# Patient Record
Sex: Male | Born: 1975 | Race: White | Hispanic: No | Marital: Married | Smoking: Never smoker
Health system: Southern US, Community
[De-identification: ages and names within clinical notes are randomized; demographics above are authoritative.]

## PROBLEM LIST (undated history)

## (undated) DIAGNOSIS — K219 Gastro-esophageal reflux disease without esophagitis: Secondary | ICD-10-CM

## (undated) DIAGNOSIS — T8859XA Other complications of anesthesia, initial encounter: Secondary | ICD-10-CM

## (undated) DIAGNOSIS — I82409 Acute embolism and thrombosis of unspecified deep veins of unspecified lower extremity: Secondary | ICD-10-CM

## (undated) DIAGNOSIS — M5136 Other intervertebral disc degeneration, lumbar region: Secondary | ICD-10-CM

## (undated) DIAGNOSIS — F419 Anxiety disorder, unspecified: Secondary | ICD-10-CM

## (undated) DIAGNOSIS — Z87442 Personal history of urinary calculi: Secondary | ICD-10-CM

## (undated) DIAGNOSIS — M51369 Other intervertebral disc degeneration, lumbar region without mention of lumbar back pain or lower extremity pain: Secondary | ICD-10-CM

## (undated) DIAGNOSIS — I2699 Other pulmonary embolism without acute cor pulmonale: Secondary | ICD-10-CM

## (undated) DIAGNOSIS — T4145XA Adverse effect of unspecified anesthetic, initial encounter: Secondary | ICD-10-CM

## (undated) DIAGNOSIS — Z889 Allergy status to unspecified drugs, medicaments and biological substances status: Secondary | ICD-10-CM

## (undated) HISTORY — DX: Other pulmonary embolism without acute cor pulmonale: I26.99

## (undated) HISTORY — PX: COLONOSCOPY: SHX174

## (undated) HISTORY — DX: Acute embolism and thrombosis of unspecified deep veins of unspecified lower extremity: I82.409

## (undated) HISTORY — DX: Morbid (severe) obesity due to excess calories: E66.01

## (undated) HISTORY — PX: ESOPHAGOGASTRODUODENOSCOPY: SHX1529

## (undated) HISTORY — PX: BACK SURGERY: SHX140

## (undated) HISTORY — PX: OTHER SURGICAL HISTORY: SHX169

## (undated) HISTORY — PX: KNEE SURGERY: SHX244

## (undated) HISTORY — DX: Gastro-esophageal reflux disease without esophagitis: K21.9

---

## 2000-05-28 ENCOUNTER — Ambulatory Visit (HOSPITAL_COMMUNITY): Admission: RE | Admit: 2000-05-28 | Discharge: 2000-05-28 | Payer: Self-pay | Admitting: Neurosurgery

## 2014-04-14 ENCOUNTER — Other Ambulatory Visit (HOSPITAL_COMMUNITY): Payer: Self-pay | Admitting: *Deleted

## 2014-04-14 ENCOUNTER — Encounter (HOSPITAL_COMMUNITY): Payer: Self-pay | Admitting: Pharmacy Technician

## 2014-04-14 ENCOUNTER — Encounter (HOSPITAL_COMMUNITY): Payer: Self-pay | Admitting: *Deleted

## 2014-04-15 ENCOUNTER — Ambulatory Visit (HOSPITAL_COMMUNITY): Payer: BC Managed Care – PPO | Admitting: Critical Care Medicine

## 2014-04-15 ENCOUNTER — Observation Stay (HOSPITAL_COMMUNITY)
Admission: RE | Admit: 2014-04-15 | Discharge: 2014-04-16 | Disposition: A | Discharge status: Home / Self Care | Payer: BC Managed Care – PPO | Source: Ambulatory Visit | Attending: Neurosurgery | Admitting: Neurosurgery

## 2014-04-15 ENCOUNTER — Encounter (HOSPITAL_COMMUNITY): Payer: Self-pay | Admitting: *Deleted

## 2014-04-15 ENCOUNTER — Ambulatory Visit (HOSPITAL_COMMUNITY): Payer: BC Managed Care – PPO

## 2014-04-15 ENCOUNTER — Encounter (HOSPITAL_COMMUNITY)
Admission: RE | Disposition: A | Discharge status: Home / Self Care | Payer: Self-pay | Source: Ambulatory Visit | Attending: Neurosurgery

## 2014-04-15 ENCOUNTER — Encounter (HOSPITAL_COMMUNITY): Payer: BC Managed Care – PPO | Admitting: Critical Care Medicine

## 2014-04-15 DIAGNOSIS — M5126 Other intervertebral disc displacement, lumbar region: Principal | ICD-10-CM | POA: Insufficient documentation

## 2014-04-15 DIAGNOSIS — K219 Gastro-esophageal reflux disease without esophagitis: Secondary | ICD-10-CM | POA: Insufficient documentation

## 2014-04-15 HISTORY — PX: LUMBAR LAMINECTOMY/DECOMPRESSION MICRODISCECTOMY: SHX5026

## 2014-04-15 LAB — CBC
HEMATOCRIT: 45.6 % (ref 39.0–52.0)
Hemoglobin: 16.3 g/dL (ref 13.0–17.0)
MCH: 32.9 pg (ref 26.0–34.0)
MCHC: 35.7 g/dL (ref 30.0–36.0)
MCV: 91.9 fL (ref 78.0–100.0)
Platelets: 223 10*3/uL (ref 150–400)
RBC: 4.96 MIL/uL (ref 4.22–5.81)
RDW: 13 % (ref 11.5–15.5)
WBC: 8.7 10*3/uL (ref 4.0–10.5)

## 2014-04-15 LAB — SURGICAL PCR SCREEN
MRSA, PCR: NEGATIVE
Staphylococcus aureus: NEGATIVE

## 2014-04-15 SURGERY — LUMBAR LAMINECTOMY/DECOMPRESSION MICRODISCECTOMY 1 LEVEL
Anesthesia: General | Site: Back | Laterality: Left

## 2014-04-15 MED ORDER — ALUM & MAG HYDROXIDE-SIMETH 200-200-20 MG/5ML PO SUSP: 30.0000 mL | Freq: Four times a day (QID) | ORAL | Status: DC | PRN

## 2014-04-15 MED ORDER — ACETAMINOPHEN 325 MG PO TABS: 650.0000 mg | ORAL_TABLET | ORAL | Status: DC | PRN

## 2014-04-15 MED ORDER — MIDAZOLAM HCL 2 MG/2ML IJ SOLN: 0.5000 mg | Freq: Once | INTRAMUSCULAR | Status: DC | PRN

## 2014-04-15 MED ORDER — MIDAZOLAM HCL 2 MG/2ML IJ SOLN
INTRAMUSCULAR | Status: AC
  Filled 2014-04-15: qty 2

## 2014-04-15 MED ORDER — OXYCODONE-ACETAMINOPHEN 5-325 MG PO TABS
1.0000 | ORAL_TABLET | ORAL | Status: DC | PRN
Start: 2014-04-15 — End: 2014-04-15

## 2014-04-15 MED ORDER — LIDOCAINE HCL (CARDIAC) 20 MG/ML IV SOLN
INTRAVENOUS | Status: DC | PRN
  Administered 2014-04-15: 20 mg via INTRAVENOUS

## 2014-04-15 MED ORDER — MEPERIDINE HCL 25 MG/ML IJ SOLN
INTRAMUSCULAR | Status: AC
  Filled 2014-04-15: qty 1

## 2014-04-15 MED ORDER — CEFAZOLIN SODIUM-DEXTROSE 2-3 GM-% IV SOLR
INTRAVENOUS | Status: AC
  Filled 2014-04-15: qty 50

## 2014-04-15 MED ORDER — OXYCODONE HCL 5 MG/5ML PO SOLN: 5.0000 mg | Freq: Once | ORAL | Status: AC | PRN

## 2014-04-15 MED ORDER — 0.9 % SODIUM CHLORIDE (POUR BTL) OPTIME
TOPICAL | Status: DC | PRN
  Administered 2014-04-15: 1000 mL

## 2014-04-15 MED ORDER — OXYCODONE HCL 5 MG/5ML PO SOLN: 5.0000 mg | Freq: Once | ORAL | Status: DC | PRN

## 2014-04-15 MED ORDER — HYDROMORPHONE HCL PF 1 MG/ML IJ SOLN: 0.2500 mg | INTRAMUSCULAR | Status: DC | PRN

## 2014-04-15 MED ORDER — OXYCODONE-ACETAMINOPHEN 10-325 MG PO TABS
1.0000 | ORAL_TABLET | ORAL | Status: DC | PRN
Start: 2014-04-15 — End: 2014-04-15

## 2014-04-15 MED ORDER — ROCURONIUM BROMIDE 100 MG/10ML IV SOLN
INTRAVENOUS | Status: DC | PRN
  Administered 2014-04-15: 50 mg via INTRAVENOUS

## 2014-04-15 MED ORDER — MEPERIDINE HCL 25 MG/ML IJ SOLN
6.2500 mg | INTRAMUSCULAR | Status: DC | PRN
  Administered 2014-04-15: 12.5 mg via INTRAVENOUS

## 2014-04-15 MED ORDER — FENTANYL CITRATE 0.05 MG/ML IJ SOLN
INTRAMUSCULAR | Status: DC | PRN
  Administered 2014-04-15: 150 ug via INTRAVENOUS
  Administered 2014-04-15: 100 ug via INTRAVENOUS

## 2014-04-15 MED ORDER — LIDOCAINE HCL (CARDIAC) 20 MG/ML IV SOLN
INTRAVENOUS | Status: AC
  Filled 2014-04-15: qty 5

## 2014-04-15 MED ORDER — MUPIROCIN 2 % EX OINT
TOPICAL_OINTMENT | Freq: Two times a day (BID) | CUTANEOUS | Status: DC
  Administered 2014-04-15: 1 via NASAL

## 2014-04-15 MED ORDER — OXYCODONE HCL 5 MG PO TABS: 5.0000 mg | ORAL_TABLET | Freq: Once | ORAL | Status: DC | PRN

## 2014-04-15 MED ORDER — THROMBIN 5000 UNITS EX SOLR
CUTANEOUS | Status: DC | PRN
  Administered 2014-04-15 (×2): 5000 [IU] via TOPICAL

## 2014-04-15 MED ORDER — MUPIROCIN 2 % EX OINT
TOPICAL_OINTMENT | CUTANEOUS | Status: AC
  Filled 2014-04-15: qty 22

## 2014-04-15 MED ORDER — PHENOL 1.4 % MT LIQD: 1.0000 | OROMUCOSAL | Status: DC | PRN

## 2014-04-15 MED ORDER — DOCUSATE SODIUM 100 MG PO CAPS
100.0000 mg | ORAL_CAPSULE | Freq: Two times a day (BID) | ORAL | Status: DC
  Filled 2014-04-15 (×3): qty 1

## 2014-04-15 MED ORDER — FENTANYL CITRATE 0.05 MG/ML IJ SOLN
INTRAMUSCULAR | Status: AC
  Filled 2014-04-15: qty 5

## 2014-04-15 MED ORDER — DIAZEPAM 5 MG PO TABS: 5.0000 mg | ORAL_TABLET | Freq: Four times a day (QID) | ORAL | Status: DC | PRN

## 2014-04-15 MED ORDER — BUPIVACAINE-EPINEPHRINE (PF) 0.5% -1:200000 IJ SOLN
INTRAMUSCULAR | Status: DC | PRN
  Administered 2014-04-15: 10 mL

## 2014-04-15 MED ORDER — CEFAZOLIN SODIUM-DEXTROSE 2-3 GM-% IV SOLR
2.0000 g | Freq: Three times a day (TID) | INTRAVENOUS | Status: DC
  Administered 2014-04-15: 2 g via INTRAVENOUS
  Filled 2014-04-15 (×2): qty 50

## 2014-04-15 MED ORDER — MIDAZOLAM HCL 5 MG/5ML IJ SOLN
INTRAMUSCULAR | Status: DC | PRN
  Administered 2014-04-15: 2 mg via INTRAVENOUS

## 2014-04-15 MED ORDER — SODIUM CHLORIDE 0.9 % IR SOLN
Status: DC | PRN
  Administered 2014-04-15: 17:00:00

## 2014-04-15 MED ORDER — PROMETHAZINE HCL 25 MG/ML IJ SOLN: 6.2500 mg | INTRAMUSCULAR | Status: DC | PRN

## 2014-04-15 MED ORDER — ARTIFICIAL TEARS OP OINT
TOPICAL_OINTMENT | OPHTHALMIC | Status: AC
  Filled 2014-04-15: qty 3.5

## 2014-04-15 MED ORDER — MUPIROCIN 2 % EX OINT
TOPICAL_OINTMENT | Freq: Two times a day (BID) | CUTANEOUS | Status: DC
  Filled 2014-04-15: qty 22

## 2014-04-15 MED ORDER — ROCURONIUM BROMIDE 50 MG/5ML IV SOLN
INTRAVENOUS | Status: AC
  Filled 2014-04-15: qty 1

## 2014-04-15 MED ORDER — ONDANSETRON HCL 4 MG/2ML IJ SOLN
INTRAMUSCULAR | Status: DC | PRN
  Administered 2014-04-15: 4 mg via INTRAVENOUS

## 2014-04-15 MED ORDER — OXYCODONE-ACETAMINOPHEN 5-325 MG PO TABS
1.0000 | ORAL_TABLET | ORAL | Status: DC | PRN
  Administered 2014-04-16: 2 via ORAL
  Filled 2014-04-15: qty 2

## 2014-04-15 MED ORDER — OXYCODONE HCL 5 MG PO TABS
5.0000 mg | ORAL_TABLET | Freq: Once | ORAL | Status: AC | PRN
  Administered 2014-04-15: 5 mg via ORAL

## 2014-04-15 MED ORDER — NEOSTIGMINE METHYLSULFATE 10 MG/10ML IV SOLN
INTRAVENOUS | Status: DC | PRN
  Administered 2014-04-15: 3 mg via INTRAVENOUS

## 2014-04-15 MED ORDER — PROPOFOL 10 MG/ML IV BOLUS
INTRAVENOUS | Status: DC | PRN
  Administered 2014-04-15: 200 mg via INTRAVENOUS

## 2014-04-15 MED ORDER — MEPERIDINE HCL 25 MG/ML IJ SOLN: 6.2500 mg | INTRAMUSCULAR | Status: DC | PRN

## 2014-04-15 MED ORDER — CEFAZOLIN SODIUM-DEXTROSE 2-3 GM-% IV SOLR
INTRAVENOUS | Status: DC | PRN
  Administered 2014-04-15: 2 g via INTRAVENOUS

## 2014-04-15 MED ORDER — MENTHOL 3 MG MT LOZG: 1.0000 | LOZENGE | OROMUCOSAL | Status: DC | PRN

## 2014-04-15 MED ORDER — HEMOSTATIC AGENTS (NO CHARGE) OPTIME
TOPICAL | Status: DC | PRN
  Administered 2014-04-15: 1 via TOPICAL

## 2014-04-15 MED ORDER — ARTIFICIAL TEARS OP OINT
TOPICAL_OINTMENT | OPHTHALMIC | Status: DC | PRN
  Administered 2014-04-15: 1 via OPHTHALMIC

## 2014-04-15 MED ORDER — LACTATED RINGERS IV SOLN
INTRAVENOUS | Status: DC
  Administered 2014-04-15: 12:00:00 via INTRAVENOUS

## 2014-04-15 MED ORDER — OXYCODONE HCL 5 MG PO TABS: 5.0000 mg | ORAL_TABLET | ORAL | Status: DC | PRN

## 2014-04-15 MED ORDER — GLYCOPYRROLATE 0.2 MG/ML IJ SOLN
INTRAMUSCULAR | Status: AC
  Filled 2014-04-15: qty 2

## 2014-04-15 MED ORDER — MORPHINE SULFATE 2 MG/ML IJ SOLN: 1.0000 mg | INTRAMUSCULAR | Status: DC | PRN

## 2014-04-15 MED ORDER — HYDROCODONE-ACETAMINOPHEN 5-325 MG PO TABS
1.0000 | ORAL_TABLET | ORAL | Status: DC | PRN
Start: 2014-04-15 — End: 2014-04-16

## 2014-04-15 MED ORDER — ACETAMINOPHEN 650 MG RE SUPP: 650.0000 mg | RECTAL | Status: DC | PRN

## 2014-04-15 MED ORDER — ONDANSETRON HCL 4 MG/2ML IJ SOLN: 4.0000 mg | INTRAMUSCULAR | Status: DC | PRN

## 2014-04-15 MED ORDER — LACTATED RINGERS IV SOLN: INTRAVENOUS | Status: DC

## 2014-04-15 MED ORDER — GLYCOPYRROLATE 0.2 MG/ML IJ SOLN
INTRAMUSCULAR | Status: DC | PRN
  Administered 2014-04-15: 0.4 mg via INTRAVENOUS

## 2014-04-15 MED ORDER — ONDANSETRON HCL 4 MG/2ML IJ SOLN
INTRAMUSCULAR | Status: AC
  Filled 2014-04-15: qty 2

## 2014-04-15 MED ORDER — HYDROMORPHONE HCL PF 1 MG/ML IJ SOLN
0.2500 mg | INTRAMUSCULAR | Status: DC | PRN
  Administered 2014-04-15: 0.5 mg via INTRAVENOUS

## 2014-04-15 MED ORDER — HYDROMORPHONE HCL PF 1 MG/ML IJ SOLN
INTRAMUSCULAR | Status: AC
  Filled 2014-04-15: qty 1

## 2014-04-15 MED ORDER — OXYCODONE HCL 5 MG PO TABS
ORAL_TABLET | ORAL | Status: AC
  Filled 2014-04-15: qty 1

## 2014-04-15 SURGICAL SUPPLY — 60 items
APL SKNCLS STERI-STRIP NONHPOA (GAUZE/BANDAGES/DRESSINGS) ×1
BAG DECANTER FOR FLEXI CONT (MISCELLANEOUS) ×3 IMPLANT
BENZOIN TINCTURE PRP APPL 2/3 (GAUZE/BANDAGES/DRESSINGS) ×3 IMPLANT
BLADE 10 SAFETY STRL DISP (BLADE) ×3 IMPLANT
BLADE SURG ROTATE 9660 (MISCELLANEOUS) IMPLANT
BRUSH SCRUB EZ PLAIN DRY (MISCELLANEOUS) ×3 IMPLANT
BUR ACORN 6.0 (BURR) ×2 IMPLANT
BUR ACORN 6.0MM (BURR) ×1
BUR MATCHSTICK NEURO 3.0 LAGG (BURR) ×3 IMPLANT
CANISTER SUCT 3000ML (MISCELLANEOUS) ×3 IMPLANT
CLOSURE WOUND 1/2 X4 (GAUZE/BANDAGES/DRESSINGS) ×1
CONT SPEC 4OZ CLIKSEAL STRL BL (MISCELLANEOUS) ×3 IMPLANT
DRAPE LAPAROTOMY 100X72X124 (DRAPES) ×3 IMPLANT
DRAPE MICROSCOPE LEICA (MISCELLANEOUS) ×3 IMPLANT
DRAPE POUCH INSTRU U-SHP 10X18 (DRAPES) ×3 IMPLANT
DRAPE SURG 17X23 STRL (DRAPES) ×12 IMPLANT
ELECT BLADE 4.0 EZ CLEAN MEGAD (MISCELLANEOUS) ×3
ELECT REM PT RETURN 9FT ADLT (ELECTROSURGICAL) ×3
ELECTRODE BLDE 4.0 EZ CLN MEGD (MISCELLANEOUS) ×1 IMPLANT
ELECTRODE REM PT RTRN 9FT ADLT (ELECTROSURGICAL) ×1 IMPLANT
GAUZE SPONGE 4X4 16PLY XRAY LF (GAUZE/BANDAGES/DRESSINGS) IMPLANT
GLOVE BIO SURGEON STRL SZ8.5 (GLOVE) ×3 IMPLANT
GLOVE BIOGEL PI IND STRL 7.5 (GLOVE) IMPLANT
GLOVE BIOGEL PI INDICATOR 7.5 (GLOVE) ×2
GLOVE ECLIPSE 7.5 STRL STRAW (GLOVE) ×4 IMPLANT
GLOVE EXAM NITRILE LRG STRL (GLOVE) IMPLANT
GLOVE EXAM NITRILE MD LF STRL (GLOVE) ×2 IMPLANT
GLOVE EXAM NITRILE XL STR (GLOVE) IMPLANT
GLOVE EXAM NITRILE XS STR PU (GLOVE) IMPLANT
GLOVE SS BIOGEL STRL SZ 8 (GLOVE) ×1 IMPLANT
GLOVE SUPERSENSE BIOGEL SZ 8 (GLOVE) ×2
GOWN BRE IMP SLV AUR LG STRL (GOWN DISPOSABLE) IMPLANT
GOWN BRE IMP SLV AUR XL STRL (GOWN DISPOSABLE) ×1 IMPLANT
GOWN STRL REIN 2XL LVL4 (GOWN DISPOSABLE) IMPLANT
GOWN STRL REUS W/ TWL LRG LVL3 (GOWN DISPOSABLE) IMPLANT
GOWN STRL REUS W/ TWL XL LVL3 (GOWN DISPOSABLE) IMPLANT
GOWN STRL REUS W/TWL LRG LVL3 (GOWN DISPOSABLE) ×3
GOWN STRL REUS W/TWL XL LVL3 (GOWN DISPOSABLE) ×6
KIT BASIN OR (CUSTOM PROCEDURE TRAY) ×3 IMPLANT
KIT ROOM TURNOVER OR (KITS) ×3 IMPLANT
NDL HYPO 21X1.5 SAFETY (NEEDLE) IMPLANT
NEEDLE HYPO 21X1.5 SAFETY (NEEDLE) IMPLANT
NEEDLE HYPO 22GX1.5 SAFETY (NEEDLE) ×3 IMPLANT
NS IRRIG 1000ML POUR BTL (IV SOLUTION) ×3 IMPLANT
PACK LAMINECTOMY NEURO (CUSTOM PROCEDURE TRAY) ×3 IMPLANT
PAD ARMBOARD 7.5X6 YLW CONV (MISCELLANEOUS) ×9 IMPLANT
PATTIES SURGICAL .5 X1 (DISPOSABLE) IMPLANT
RUBBERBAND STERILE (MISCELLANEOUS) ×6 IMPLANT
SPONGE GAUZE 4X4 12PLY (GAUZE/BANDAGES/DRESSINGS) ×3 IMPLANT
SPONGE SURGIFOAM ABS GEL SZ50 (HEMOSTASIS) ×3 IMPLANT
STRIP CLOSURE SKIN 1/2X4 (GAUZE/BANDAGES/DRESSINGS) ×2 IMPLANT
SUT VIC AB 1 CT1 18XBRD ANBCTR (SUTURE) ×1 IMPLANT
SUT VIC AB 1 CT1 8-18 (SUTURE) ×6
SUT VIC AB 2-0 CP2 18 (SUTURE) ×5 IMPLANT
SYR 20CC LL (SYRINGE) IMPLANT
SYR 20ML ECCENTRIC (SYRINGE) ×3 IMPLANT
TAPE CLOTH SURG 4X10 WHT LF (GAUZE/BANDAGES/DRESSINGS) ×2 IMPLANT
TOWEL OR 17X24 6PK STRL BLUE (TOWEL DISPOSABLE) ×3 IMPLANT
TOWEL OR 17X26 10 PK STRL BLUE (TOWEL DISPOSABLE) ×3 IMPLANT
WATER STERILE IRR 1000ML POUR (IV SOLUTION) ×3 IMPLANT

## 2014-04-15 NOTE — Anesthesia Preprocedure Evaluation (Addendum)
Anesthesia Evaluation  Patient identified by MRN, date of birth, ID band Patient awake    Reviewed: Allergy & Precautions, H&P , NPO status , Patient's Chart, lab work & pertinent test results  History of Anesthesia Complications Negative for: history of anesthetic complications  Airway Mallampati: II TM Distance: >3 FB Neck ROM: Full    Dental  (+) Dental Advisory Given   Pulmonary neg pulmonary ROS,  breath sounds clear to auscultation  Pulmonary exam normal       Cardiovascular negative cardio ROS  Rhythm:Regular Rate:Normal     Neuro/Psych Chronic back pain    GI/Hepatic Neg liver ROS, GERD-  Controlled,  Endo/Other  Morbid obesity  Renal/GU negative Renal ROS     Musculoskeletal   Abdominal (+) + obese,   Peds  Hematology   Anesthesia Other Findings   Reproductive/Obstetrics                         Anesthesia Physical Anesthesia Plan  ASA: II  Anesthesia Plan: General   Post-op Pain Management:    Induction: Intravenous  Airway Management Planned: Oral ETT  Additional Equipment:   Intra-op Plan:   Post-operative Plan: Extubation in OR  Informed Consent: I have reviewed the patients History and Physical, chart, labs and discussed the procedure including the risks, benefits and alternatives for the proposed anesthesia with the patient or authorized representative who has indicated his/her understanding and acceptance.   Dental advisory given  Plan Discussed with: Anesthesiologist, Surgeon and CRNA  Anesthesia Plan Comments: (Plan routine monitors, GETA)       Anesthesia Quick Evaluation

## 2014-04-15 NOTE — Progress Notes (Signed)
Patient ID: Luis Roberts, male   DOB: [birth year], 38 y.o.   MRN: [id number] Subjective:  The patient is alert and pleasant. He is in no apparent distress.  Objective: Vital signs in last 24 hours: Temp:  [98.4 F (36.9 C)] 98.4 F (36.9 C) (04/30 1210) Pulse Rate:  [81] 81 (04/30 1210) Resp:  [20] 20 (04/30 1210) BP: (146)/(85) 146/85 mmHg (04/30 1218) SpO2:  [100 %] 100 % (04/30 1210) Weight:  [120.657 kg (266 lb)] 120.657 kg (266 lb) (04/30 1210)  Intake/Output from previous day:   Intake/Output this shift: Total I/O In: 700 [I.V.:700] Out: 50 [Blood:50]  Physical exam the patient is alert and pleasant. He is moving his lower extremities well.  Lab Results:  Recent Labs  04/15/14 1208  WBC 8.7  HGB 16.3  HCT 45.6  PLT 223   BMET No results found for this basename: NA, K, CL, CO2, GLUCOSE, BUN, CREATININE, CALCIUM,  in the last 72 hours  Studies/Results: No results found.  Assessment/Plan: The patient is doing well.  LOS: 0 days     [name] 04/15/2014, 6:12 PM

## 2014-04-15 NOTE — Op Note (Signed)
Brief history: The patient is a 38 year old white male on whom I previously performed a right L5-S1 discectomy many years ago. The patient has done well to recently when he developed severe back and left leg pain consistent with a lumbar radiculopathy. He was worked up with a lumbar MRI which demonstrated a large herniated disc at L4-5 on the left. I discussed the various treatment options with the patient including surgery. He has weighed the risks, benefits, and alternatives surgery and decided proceed with a left L4-5 discectomy.  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 Earle Gell  Asst.: Dr. Erline Levine  Anesthesia: Gen. endotracheal  Estimated blood loss: Minimal  Drains: None  Complications: None  Description of procedure: The patient was brought to the operating room by the anesthesia team. General endotracheal anesthesia was induced. The patient was turned to the prone position on the Wilson frame. The patient's lumbosacral region was then prepared with Betadine scrub and Betadine solution. Sterile drapes were applied.  I then injected the area to be incised with Marcaine with epinephrine solution. I then used a scalpel to make a linear midline incision over the L4-5 intervertebral disc space. I then used electrocautery to perform a left sided subperiosteal dissection exposing the spinous process and lamina of L4 and L5. We obtained intraoperative radiograph to confirm our location. I then inserted the [facility] retractor for exposure.  We then brought the operative microscope into the field. Under its magnification and illumination we completed the microdissection. I used a high-speed drill to perform a laminotomy at L4 on the left. I then used a Kerrison punches to widen the laminotomy and removed the ligamentum flavum at  L4-5 on the left and to remove the cephalad aspect of the L5 lamina.. We then used microdissection to free up the thecal sac and the left L5 nerve root from the epidural tissue. I then used a Kerrison punch to perform a foraminotomy at about the L5 nerve root. We then using the nerve root retractor to gently retract the thecal sac and the left L5 nerve root medially. This exposed the intervertebral disc. We identified the ruptured disc and remove it with the pituitary forceps. We encountered a moderate size hole in the annulus and performed a partial intervertebral discectomy.  I then palpated along the ventral surface of the thecal sac and along exit route of the L5 nerve root and noted that the neural structures were well decompressed. This completed the decompression.  We then obtained hemostasis using bipolar electrocautery. We irrigated the wound out with bacitracin solution. We then removed the retractor. We then reapproximated the patient's thoracolumbar fascia with interrupted #1 Vicryl suture. We then reapproximated the patient's subcutaneous tissue with interrupted 2-0 Vicryl suture. At this point I was informed of the sponge count was incorrect by one sponge. I reopened the wound and explored the wound I did not find a sponge. We obtained intraoperative radiograph which did not demonstrate any foreign bodies in the patient's wound. A sponge was subtotally found outside of the operating room in the lateral edge. We can only assume this was the missing sponge. I then removed the retractor and reapproximated patient's thoracolumbar fascia with interrupted #1 Vicryl suture. We reapproximated patient's subcutaneous suture with interrupted 2-0 Vicryl suture.  We then reapproximated patient's skin with Steri-Strips and benzoin. The was then coated with bacitracin ointment. The drapes were removed. The  patient was subsequently returned to the supine position where they were extubated by the anesthesia team.  The patient was then transported to the postanesthesia care unit in stable condition.

## 2014-04-15 NOTE — Plan of Care (Signed)
Problem: Consults Goal: Diagnosis - Spinal Surgery Outcome: Completed/Met Date Met:  04/15/14 Lumbar Laminectomy (Complex)     

## 2014-04-15 NOTE — Anesthesia Procedure Notes (Signed)
Procedure Name: Intubation Date/Time: 04/15/2014 4:29 PM Performed by: Carola Frost Pre-anesthesia Checklist: Patient identified, Timeout performed, Emergency Drugs available, Suction available and Patient being monitored Patient Re-evaluated:Patient Re-evaluated prior to inductionOxygen Delivery Method: Circle system utilized Preoxygenation: Pre-oxygenation with 100% oxygen Intubation Type: IV induction Ventilation: Mask ventilation without difficulty Laryngoscope Size: Mac and 4 Grade View: Grade I Tube type: Oral Tube size: 7.5 mm Number of attempts: 1 Airway Equipment and Method: Stylet Placement Confirmation: CO2 detector,  positive ETCO2,  ETT inserted through vocal cords under direct vision and breath sounds checked- equal and bilateral Secured at: 23 cm Tube secured with: Tape Dental Injury: Teeth and Oropharynx as per pre-operative assessment

## 2014-04-15 NOTE — H&P (Signed)
Subjective: The patient is a 38 year old white male on whom I  previously performed a right L5-S1 discectomy. The patient has done well for years but has developed recurrent back pain with pain rate down his left leg. He has failed medical management and was worked up with a lumbar MRI. This demonstrated a large herniated disc at L4-5 on the left. I discussed the various treatment option with the patient including surgery. He has weighed the risks, benefits, and alternatives surgery and decided proceed with a left L4-5 discectomy.  History reviewed. No pertinent past medical history.  Past Surgical History  Procedure Laterality Date  . Arthroscopic knee surgery    . Knee surgery      left knee w/ screw  . Back surgery      lumbar laminectomy    Allergies  Allergen Reactions  . Aleve [Naproxen Sodium] Other (See Comments)    Makes his skin feel like it's crawling    History  Substance Use Topics  . Smoking status: Never Smoker   . Smokeless tobacco: Never Used  . Alcohol Use: Yes     Comment: socially    Family History  Problem Relation Age of Onset  . Hypertension Mother   . Pulmonary embolism Father    Prior to Admission medications   Medication Sig Start Date End Date Taking? Authorizing Provider  Glucos-Chondroit-Hyaluron-MSM (GLUCOSAMINE CHONDROITIN JOINT PO) Take 2 capsules by mouth daily.   Yes Historical Provider, MD  Multiple Vitamin (MULTIVITAMIN WITH MINERALS) TABS tablet Take 1 tablet by mouth daily.   Yes Historical Provider, MD  Omega-3 Fatty Acids (FISH OIL PO) Take 2 capsules by mouth daily.   Yes Historical Provider, MD  oxyCODONE-acetaminophen (PERCOCET) 10-325 MG per tablet Take 1 tablet by mouth every 4 (four) hours as needed for pain.   Yes Historical Provider, MD     Review of Systems  Positive ROS: As above  All other systems have been reviewed and were otherwise negative with the exception of those mentioned in the HPI and as  above.  Objective: Vital signs in last 24 hours:    General Appearance: Alert, cooperative, no distress, Head: Normocephalic, without obvious abnormality, atraumatic Eyes: PERRL, conjunctiva/corneas clear, EOM's intact,    Ears: Normal  Throat: Normal  Neck: Supple, symmetrical, trachea midline, no adenopathy; thyroid: No enlargement/tenderness/nodules; no carotid bruit or JVD Back: Symmetric, no curvature, ROM normal, no CVA tenderness Lungs: Clear to auscultation bilaterally, respirations unlabored Heart: Regular rate and rhythm, no murmur, rub or gallop Abdomen: Soft, non-tender,, no masses, no organomegaly Extremities: Extremities normal, atraumatic, no cyanosis or edema Pulses: 2+ and symmetric all extremities Skin: Skin color, texture, turgor normal, no rashes or lesions  NEUROLOGIC:   Mental status: alert and oriented, no aphasia, good attention span, Fund of knowledge/ memory ok Motor Exam - grossly normal, except the patient has some slight weakness in his left EHL. Sensory Exam - grossly normal Reflexes:  Coordination - grossly normal Gait - grossly normal Balance - grossly normal Cranial Nerves: I: smell Not tested  II: visual acuity  OS: Normal  OD: Normal   II: visual fields Full to confrontation  II: pupils Equal, round, reactive to light  III,VII: ptosis None  III,IV,VI: extraocular muscles  Full ROM  V: mastication Normal  V: facial light touch sensation  Normal  V,VII: corneal reflex  Present  VII: facial muscle function - upper  Normal  VII: facial muscle function - lower Normal  VIII: hearing Not tested  IX: soft palate elevation  Normal  IX,X: gag reflex Present  XI: trapezius strength  5/5  XI: sternocleidomastoid strength 5/5  XI: neck flexion strength  5/5  XII: tongue strength  Normal    Data Review No results found for this basename: WBC, HGB, HCT, MCV, PLT   No results found for this basename: NA, K, CL, CO2, BUN, creatinine, glucose    No results found for this basename: INR, PROTIME    Assessment/Plan: Left L4-5 herniated disc, lumbago, lumbar radiculopathy, lumbar spinal stenosis: I discussed situation with the patient. I reviewed his MR scan with them and pointed out the abnormalities. We have discussed the various treatment options including a left L4-5 discectomy. I described the surgery to him. I have shown him surgical models. We have discussed the risks, benefits, alternatives, and likelihood of achieving our goals with surgery. I have answered all the patient's questions. He has decided to proceed with surgery.   [name] 04/15/2014 12:03 PM

## 2014-04-16 ENCOUNTER — Encounter (HOSPITAL_COMMUNITY): Payer: Self-pay | Admitting: Neurosurgery

## 2014-04-16 MED ORDER — OXYCODONE-ACETAMINOPHEN 10-325 MG PO TABS: 1.0000 | ORAL_TABLET | ORAL | Status: DC | PRN

## 2014-04-16 MED ORDER — DSS 100 MG PO CAPS: 100.0000 mg | ORAL_CAPSULE | Freq: Two times a day (BID) | ORAL | Status: DC

## 2014-04-16 MED ORDER — DIAZEPAM 5 MG PO TABS: 5.0000 mg | ORAL_TABLET | Freq: Four times a day (QID) | ORAL | Status: DC | PRN

## 2014-04-16 NOTE — Discharge Instructions (Signed)
Herniated Disk The bones of your spinal column (vertebrae) protect your spinal cord and nerves that go into your arms and legs. The vertebrae are separated by disks that cushion the spinal column and put space between your vertebrae. This allows movement between the vertebrae, which allows you to bend, rotate, and move your body from side to side. Sometimes, the disks move out of place (herniate) or break open (rupture) from injury or strain. The most common area for a disk herniation is in the lower back (lumbar area). Sometimes herniation occurs in the neck (cervical) disks.  CAUSES  As we grow older, the strong, fibrous cords that connect the vertebrae and support and surround the disks (ligaments) start to weaken. A strain on the back may cause a break in the disk ligaments. RISK FACTORS Herniated disks occur most often in men who are aged 18 years to 35 years, usually after strenuous activity. Other risk factors include conditions present at birth (congenital) that affect the size of the lumbar spinal canal. Additionally, a narrowing of the areas where the nerves exit the spinal canal can occur as you age. SYMPTOMS  Symptoms of a herniated disk vary. You may have weakness in certain muscles. This weakness can include difficulty lifting your leg or arm, difficulty standing on your toes on one side, or difficulty squeezing tightly with one of your hands. You may have numbness. You may feel a mild tingling, dull ache, or a burning or pulsating pain. In some cases, the pain is severe enough that you are unable to move. The pain most often occurs on one side of the body. The pain often starts slowly. It may get worse:  After you sit or stand.  At night.  When you sneeze, cough, or laugh.  When you bend backwards or walk more than a few yards. The pain, numbness, or weakness will often go away or improve a lot over a period of weeks to months. Herniated lumbar disk Symptoms of a herniated lumbar  disk may include sharp pain in one part of your leg, hip, or buttocks and numbness in other parts. You also may feel pain or numbness on the back of your calf or the top or sole of your foot. The same leg also may feel weak. Herniated cervical disk Symptoms of a herniated cervical disk may include pain when you move your neck, deep pain near or over your shoulder blade, or pain that moves to your upper arm, forearm, or fingers. DIAGNOSIS  To diagnose a herniated disk, your caregiver will perform a physical exam. Your caregiver also may perform diagnostic tests to see your disk or to test the reaction of your muscles and the function of your nerves. During the physical exam, your caregiver may ask you to:  Sit, stand, and walk. While you walk, your caregiver may ask you to try walking on your toes and then your heels.  Bend forward, backward, and sideways.  Raise your shoulders, elbow, wrist, and fingers and check your strength during these tasks. Your caregiver will check for:  Numbness or loss of feeling.  Muscle reflexes, which may be slower or missing.  Muscle strength, which may be weaker.  Posture or the way your spine curves. Diagnostic tests that may be done include:  A spinal X-ray exam to rule out other causes of back pain.  Magnetic resonance imaging (MRI) or computed tomography (CT) scan, which will show if the herniated disk is pressing on your spinal canal.  Electromyography.   This is sometimes used to identify the specific area of nerve involvement. TREATMENT  Initial treatment for a herniated disk is a short period of rest with medicines for pain. Pain medicines can include nonsteroidal anti-inflammatory medicines (NSAIDs), muscle relaxants for back spasms, and (rarely) narcotic pain medicine for severe pain that does not respond to NSAID use. Bed rest is often limited to 1 or 2 days at the most because prolonged rest can delay recovery. When the herniation involves the  lower back, sitting should be avoided as much as possible because sitting increases pressure on the ruptured disk. Sometimes a soft neck collar will be prescribed for a few days to weeks to help support your neck in the case of a cervical herniation. Physical therapy is often prescribed for patients with disk disease. Physical therapists will teach you how to properly lift, dress, walk, and perform other activities. They will work on strengthening the muscles that help support your spine. In some cases, physical therapy alone is not enough to treat a herniated disk. Steroid injections along the involved nerve root may be needed to help control pain. The steroid is injected in the area of the herniated disk and helps by reducing swelling around the disk. Sometimes surgery is the best option to treat a herniated disk.  SEEK IMMEDIATE MEDICAL CARE IF:   You have numbness, tingling, weakness, or problems with the use of your arms or legs.  You have severe headaches that are not relieved with the use of medicines.  You notice a change in your bowel or bladder control.  You have increasing pain in any areas of your body.  You experience shortness of breath, dizziness, or fainting. MAKE SURE YOU:   Understand these instructions.  Will watch your condition.  Will get help right away if you are not doing well or get worse. Document Released: 11/30/2000 Document Revised: 02/25/2012 Document Reviewed: 07/06/2011 [employer] Patient Information 2014 [employer].  

## 2014-04-16 NOTE — Transfer of Care (Signed)
Immediate Anesthesia Transfer of Care Note  Patient: Luis Roberts  Procedure(s) Performed: Procedure(s) with comments: LUMBAR LAMINECTOMY/DECOMPRESSION MICRODISCECTOMY 1 LEVEL (Left) - Left Lumbar Four-Five Microdiskectomy  Patient Location: PACU  Anesthesia Type:General  Level of Consciousness: awake, alert  and oriented  Airway & Oxygen Therapy: Patient Spontanous Breathing and Patient connected to nasal cannula oxygen  Post-op Assessment: Report given to PACU RN, Post -op Vital signs reviewed and stable and Patient moving all extremities X 4  Post vital signs: Reviewed and stable  Complications: No apparent anesthesia complications

## 2014-04-16 NOTE — Progress Notes (Signed)
Pt. Alert and oriented,follows simple instructions, denies pain. Incision area without swelling, redness or S/S of infection. Voiding adequate clear yellow urine. Moving all extremities well and vitals stable and documented. Patient discharged home with spouse. Lumbar surgery notes instructions given to patient and family member for home safety and precautions. Pt. and family stated understanding of instructions given.  Pain medication given prior to discharged.

## 2014-04-16 NOTE — Anesthesia Postprocedure Evaluation (Signed)
  Anesthesia Post-op Note  Patient: Luis Roberts  Procedure(s) Performed: Procedure(s) with comments: LUMBAR LAMINECTOMY/DECOMPRESSION MICRODISCECTOMY 1 LEVEL (Left) - Left Lumbar Four-Five Microdiskectomy  Patient Location: PACU  Anesthesia Type:General  Level of Consciousness: awake  Airway and Oxygen Therapy: Patient Spontanous Breathing  Post-op Pain: mild  Post-op Assessment: Post-op Vital signs reviewed, Patient's Cardiovascular Status Stable, Respiratory Function Stable, Patent Airway, No signs of Nausea or vomiting and Pain level controlled  Post-op Vital Signs: Reviewed and stable  Last Vitals:  Filed Vitals:   04/16/14 0450  BP: 127/81  Pulse: 86  Temp: 37.2 C  Resp: 18    Complications: No apparent anesthesia complications

## 2014-04-16 NOTE — Discharge Summary (Signed)
  Physician Discharge Summary  Patient ID: Luis Roberts MRN: [id number] DOB/AGE: 02/08/1976 38 y.o.  Admit date: 04/15/2014 Discharge date: 04/16/2014  Admission Diagnoses: Left L4-5 herniated disc, lumbago, lumbar radiculopathy  Discharge Diagnoses: The same Active Problems:   Lumbar herniated disc   Discharged Condition: good  Hospital Course: I performed a left L4-5 discectomy on the patient on 04/15/2014. The surgery went well.  The patient's postoperative course was unremarkable. On postop day #1 he requested discharge to home. The patient was given oral and written discharge instructions. All his questions were answered.  Consults: None Significant Diagnostic Studies: None Treatments: Left L4-5 discectomy using microdissection Discharge Exam: Blood pressure 127/81, pulse 86, temperature 99 F (37.2 C), temperature source Oral, resp. rate 18, height 6\' 5"  (1.956 m), weight 120.657 kg (266 lb), SpO2 97.00%. The patient is alert and pleasant. Her strength is normal.  Disposition: Home  Discharge Orders   Future Orders Complete By Expires   Call MD for:  difficulty breathing, headache or visual disturbances  As directed    Call MD for:  extreme fatigue  As directed    Call MD for:  hives  As directed    Call MD for:  persistant dizziness or light-headedness  As directed    Call MD for:  persistant nausea and vomiting  As directed    Call MD for:  redness, tenderness, or signs of infection (pain, swelling, redness, odor or green/yellow discharge around incision site)  As directed    Call MD for:  severe uncontrolled pain  As directed    Call MD for:  temperature >100.4  As directed    Diet - low sodium heart healthy  As directed    Discharge instructions  As directed    Driving Restrictions  As directed    Increase activity slowly  As directed    Lifting restrictions  As directed    May shower / Bathe  As directed    Remove dressing in 48 hours  As directed         Medication List         diazepam 5 MG tablet  Commonly known as:  VALIUM  Take 1 tablet (5 mg total) by mouth every 6 (six) hours as needed for muscle spasms.     DSS 100 MG Caps  Take 100 mg by mouth 2 (two) times daily.     FISH OIL PO  Take 2 capsules by mouth daily.     GLUCOSAMINE CHONDROITIN JOINT PO  Take 2 capsules by mouth daily.     multivitamin with minerals Tabs tablet  Take 1 tablet by mouth daily.     oxyCODONE-acetaminophen 10-325 MG per tablet  Commonly known as:  PERCOCET  Take 1 tablet by mouth every 4 (four) hours as needed for pain.     oxyCODONE-acetaminophen 10-325 MG per tablet  Commonly known as:  PERCOCET  Take 1 tablet by mouth every 4 (four) hours as needed for pain.         Signed: [name] 04/16/2014, 7:34 AM

## 2017-05-17 ENCOUNTER — Encounter (HOSPITAL_COMMUNITY): Payer: Self-pay | Admitting: *Deleted

## 2017-05-17 ENCOUNTER — Other Ambulatory Visit: Payer: Self-pay | Admitting: Neurosurgery

## 2017-05-20 ENCOUNTER — Encounter (HOSPITAL_COMMUNITY)
Admission: RE | Disposition: A | Discharge status: Home / Self Care | Payer: Self-pay | Source: Ambulatory Visit | Attending: Neurosurgery

## 2017-05-20 ENCOUNTER — Ambulatory Visit (HOSPITAL_COMMUNITY): Payer: 59 | Admitting: Anesthesiology

## 2017-05-20 ENCOUNTER — Encounter (HOSPITAL_COMMUNITY): Payer: Self-pay

## 2017-05-20 ENCOUNTER — Ambulatory Visit (HOSPITAL_COMMUNITY): Payer: 59

## 2017-05-20 ENCOUNTER — Ambulatory Visit (HOSPITAL_COMMUNITY)
Admission: RE | Admit: 2017-05-20 | Discharge: 2017-05-21 | Disposition: A | Discharge status: Home / Self Care | Payer: 59 | Source: Ambulatory Visit | Attending: Neurosurgery | Admitting: Neurosurgery

## 2017-05-20 DIAGNOSIS — Z886 Allergy status to analgesic agent status: Secondary | ICD-10-CM | POA: Diagnosis not present

## 2017-05-20 DIAGNOSIS — Z79899 Other long term (current) drug therapy: Secondary | ICD-10-CM | POA: Diagnosis not present

## 2017-05-20 DIAGNOSIS — M5116 Intervertebral disc disorders with radiculopathy, lumbar region: Secondary | ICD-10-CM | POA: Insufficient documentation

## 2017-05-20 DIAGNOSIS — M4726 Other spondylosis with radiculopathy, lumbar region: Secondary | ICD-10-CM | POA: Insufficient documentation

## 2017-05-20 DIAGNOSIS — Z419 Encounter for procedure for purposes other than remedying health state, unspecified: Secondary | ICD-10-CM

## 2017-05-20 DIAGNOSIS — M21372 Foot drop, left foot: Secondary | ICD-10-CM | POA: Diagnosis not present

## 2017-05-20 DIAGNOSIS — M5126 Other intervertebral disc displacement, lumbar region: Secondary | ICD-10-CM | POA: Diagnosis present

## 2017-05-20 DIAGNOSIS — Z87442 Personal history of urinary calculi: Secondary | ICD-10-CM | POA: Diagnosis not present

## 2017-05-20 DIAGNOSIS — F419 Anxiety disorder, unspecified: Secondary | ICD-10-CM | POA: Diagnosis not present

## 2017-05-20 HISTORY — DX: Other complications of anesthesia, initial encounter: T88.59XA

## 2017-05-20 HISTORY — DX: Other intervertebral disc degeneration, lumbar region: M51.36

## 2017-05-20 HISTORY — DX: Allergy status to unspecified drugs, medicaments and biological substances: Z88.9

## 2017-05-20 HISTORY — DX: Personal history of urinary calculi: Z87.442

## 2017-05-20 HISTORY — PX: LUMBAR LAMINECTOMY/DECOMPRESSION MICRODISCECTOMY: SHX5026

## 2017-05-20 HISTORY — DX: Anxiety disorder, unspecified: F41.9

## 2017-05-20 HISTORY — DX: Other intervertebral disc degeneration, lumbar region without mention of lumbar back pain or lower extremity pain: M51.369

## 2017-05-20 HISTORY — DX: Adverse effect of unspecified anesthetic, initial encounter: T41.45XA

## 2017-05-20 LAB — GLUCOSE, CAPILLARY: Glucose-Capillary: 241 mg/dL — ABNORMAL HIGH (ref 65–99)

## 2017-05-20 LAB — CBC
HEMATOCRIT: 45 % (ref 39.0–52.0)
HEMOGLOBIN: 16 g/dL (ref 13.0–17.0)
MCH: 33.5 pg (ref 26.0–34.0)
MCHC: 35.6 g/dL (ref 30.0–36.0)
MCV: 94.3 fL (ref 78.0–100.0)
Platelets: 209 10*3/uL (ref 150–400)
RBC: 4.77 MIL/uL (ref 4.22–5.81)
RDW: 12.6 % (ref 11.5–15.5)
WBC: 7.8 10*3/uL (ref 4.0–10.5)

## 2017-05-20 LAB — SURGICAL PCR SCREEN
MRSA, PCR: NEGATIVE
Staphylococcus aureus: NEGATIVE

## 2017-05-20 SURGERY — LUMBAR LAMINECTOMY/DECOMPRESSION MICRODISCECTOMY 1 LEVEL
Anesthesia: General

## 2017-05-20 MED ORDER — ACETAMINOPHEN 650 MG RE SUPP
650.0000 mg | RECTAL | Status: DC | PRN
Start: 2017-05-20 — End: 2017-05-21

## 2017-05-20 MED ORDER — ESCITALOPRAM OXALATE 10 MG PO TABS
10.0000 mg | ORAL_TABLET | Freq: Every day | ORAL | Status: DC
  Filled 2017-05-20: qty 1

## 2017-05-20 MED ORDER — CHLORHEXIDINE GLUCONATE CLOTH 2 % EX PADS: 6.0000 | MEDICATED_PAD | Freq: Once | CUTANEOUS | Status: DC

## 2017-05-20 MED ORDER — OXYCODONE-ACETAMINOPHEN 5-325 MG PO TABS
1.0000 | ORAL_TABLET | Freq: Once | ORAL | Status: AC
  Administered 2017-05-20: 1 via ORAL

## 2017-05-20 MED ORDER — MUPIROCIN 2 % EX OINT
TOPICAL_OINTMENT | CUTANEOUS | Status: AC
  Filled 2017-05-20: qty 22

## 2017-05-20 MED ORDER — LIDOCAINE 2% (20 MG/ML) 5 ML SYRINGE
INTRAMUSCULAR | Status: AC
  Filled 2017-05-20: qty 5

## 2017-05-20 MED ORDER — OXYCODONE-ACETAMINOPHEN 5-325 MG PO TABS
ORAL_TABLET | ORAL | Status: AC
  Administered 2017-05-20: 1 via ORAL
  Filled 2017-05-20: qty 1

## 2017-05-20 MED ORDER — PROMETHAZINE HCL 25 MG/ML IJ SOLN: 6.2500 mg | INTRAMUSCULAR | Status: DC | PRN

## 2017-05-20 MED ORDER — LORATADINE 10 MG PO TABS
10.0000 mg | ORAL_TABLET | Freq: Every day | ORAL | Status: DC
  Administered 2017-05-20: 10 mg via ORAL
  Filled 2017-05-20: qty 1

## 2017-05-20 MED ORDER — DEXAMETHASONE SODIUM PHOSPHATE 10 MG/ML IJ SOLN
INTRAMUSCULAR | Status: DC | PRN
  Administered 2017-05-20: 10 mg via INTRAVENOUS

## 2017-05-20 MED ORDER — SODIUM CHLORIDE 0.9 % IR SOLN
Status: DC | PRN
  Administered 2017-05-20: 17:00:00

## 2017-05-20 MED ORDER — MORPHINE SULFATE (PF) 4 MG/ML IV SOLN: 4.0000 mg | INTRAVENOUS | Status: DC | PRN

## 2017-05-20 MED ORDER — ONDANSETRON HCL 4 MG PO TABS: 4.0000 mg | ORAL_TABLET | Freq: Four times a day (QID) | ORAL | Status: DC | PRN

## 2017-05-20 MED ORDER — CEFAZOLIN SODIUM-DEXTROSE 2-4 GM/100ML-% IV SOLN
2.0000 g | Freq: Three times a day (TID) | INTRAVENOUS | Status: AC
  Administered 2017-05-20 – 2017-05-21 (×2): 2 g via INTRAVENOUS
  Filled 2017-05-20 (×2): qty 100

## 2017-05-20 MED ORDER — MIDAZOLAM HCL 5 MG/5ML IJ SOLN
INTRAMUSCULAR | Status: DC | PRN
  Administered 2017-05-20: 2 mg via INTRAVENOUS

## 2017-05-20 MED ORDER — BISACODYL 10 MG RE SUPP
10.0000 mg | Freq: Every day | RECTAL | Status: DC | PRN
Start: 2017-05-20 — End: 2017-05-21

## 2017-05-20 MED ORDER — SODIUM CHLORIDE 0.9 % IV SOLN
250.0000 mL | INTRAVENOUS | Status: DC
  Administered 2017-05-20: 250 mL via INTRAVENOUS

## 2017-05-20 MED ORDER — THROMBIN 5000 UNITS EX SOLR
CUTANEOUS | Status: DC | PRN
  Administered 2017-05-20: 10000 [IU] via TOPICAL

## 2017-05-20 MED ORDER — BUPIVACAINE-EPINEPHRINE (PF) 0.5% -1:200000 IJ SOLN
INTRAMUSCULAR | Status: DC | PRN
  Administered 2017-05-20: 10 mL via PERINEURAL

## 2017-05-20 MED ORDER — CEFAZOLIN SODIUM-DEXTROSE 2-4 GM/100ML-% IV SOLN: 2.0000 g | INTRAVENOUS | Status: DC

## 2017-05-20 MED ORDER — MENTHOL 3 MG MT LOZG
1.0000 | LOZENGE | OROMUCOSAL | Status: DC | PRN
  Administered 2017-05-21: 3 mg via ORAL
  Filled 2017-05-20: qty 9

## 2017-05-20 MED ORDER — HEMOSTATIC AGENTS (NO CHARGE) OPTIME
TOPICAL | Status: DC | PRN
  Administered 2017-05-20: 1 via TOPICAL

## 2017-05-20 MED ORDER — PHENOL 1.4 % MT LIQD: 1.0000 | OROMUCOSAL | Status: DC | PRN

## 2017-05-20 MED ORDER — SODIUM CHLORIDE 0.9% FLUSH: 3.0000 mL | INTRAVENOUS | Status: DC | PRN

## 2017-05-20 MED ORDER — FENTANYL CITRATE (PF) 100 MCG/2ML IJ SOLN
100.0000 ug | Freq: Once | INTRAMUSCULAR | Status: AC
  Administered 2017-05-20: 100 ug via INTRAVENOUS

## 2017-05-20 MED ORDER — PROPOFOL 10 MG/ML IV BOLUS
INTRAVENOUS | Status: DC | PRN
  Administered 2017-05-20: 200 mg via INTRAVENOUS

## 2017-05-20 MED ORDER — FENTANYL CITRATE (PF) 100 MCG/2ML IJ SOLN
INTRAMUSCULAR | Status: AC
  Administered 2017-05-20: 100 ug via INTRAVENOUS
  Filled 2017-05-20: qty 2

## 2017-05-20 MED ORDER — OXYCODONE HCL 5 MG PO TABS
15.0000 mg | ORAL_TABLET | ORAL | Status: DC | PRN
  Administered 2017-05-20 – 2017-05-21 (×2): 15 mg via ORAL
  Filled 2017-05-20 (×3): qty 3

## 2017-05-20 MED ORDER — ONDANSETRON HCL 4 MG/2ML IJ SOLN: 4.0000 mg | Freq: Four times a day (QID) | INTRAMUSCULAR | Status: DC | PRN

## 2017-05-20 MED ORDER — HYDROMORPHONE HCL 1 MG/ML IJ SOLN: 0.2500 mg | INTRAMUSCULAR | Status: DC | PRN

## 2017-05-20 MED ORDER — LIDOCAINE HCL (CARDIAC) 20 MG/ML IV SOLN
INTRAVENOUS | Status: DC | PRN
  Administered 2017-05-20: 70 mg via INTRAVENOUS

## 2017-05-20 MED ORDER — BUPIVACAINE-EPINEPHRINE (PF) 0.5% -1:200000 IJ SOLN
INTRAMUSCULAR | Status: AC
Start: 2017-05-20 — End: ?
  Filled 2017-05-20: qty 30

## 2017-05-20 MED ORDER — THROMBIN 5000 UNITS EX SOLR
CUTANEOUS | Status: AC
  Filled 2017-05-20: qty 15000

## 2017-05-20 MED ORDER — SODIUM CHLORIDE 0.9% FLUSH
3.0000 mL | Freq: Two times a day (BID) | INTRAVENOUS | Status: DC
  Administered 2017-05-20: 3 mL via INTRAVENOUS

## 2017-05-20 MED ORDER — CYCLOBENZAPRINE HCL 10 MG PO TABS
10.0000 mg | ORAL_TABLET | Freq: Three times a day (TID) | ORAL | Status: DC | PRN
  Administered 2017-05-21: 10 mg via ORAL
  Filled 2017-05-20: qty 1

## 2017-05-20 MED ORDER — LACTATED RINGERS IV SOLN
INTRAVENOUS | Status: DC
  Administered 2017-05-20 (×2): via INTRAVENOUS

## 2017-05-20 MED ORDER — SUGAMMADEX SODIUM 500 MG/5ML IV SOLN
INTRAVENOUS | Status: DC | PRN
  Administered 2017-05-20: 400 mg via INTRAVENOUS

## 2017-05-20 MED ORDER — MIDAZOLAM HCL 2 MG/2ML IJ SOLN
INTRAMUSCULAR | Status: AC
  Filled 2017-05-20: qty 2

## 2017-05-20 MED ORDER — GELATIN ABSORBABLE MT POWD
OROMUCOSAL | Status: DC | PRN
  Administered 2017-05-20: 18:00:00 via TOPICAL

## 2017-05-20 MED ORDER — FENTANYL CITRATE (PF) 100 MCG/2ML IJ SOLN
INTRAMUSCULAR | Status: DC | PRN
  Administered 2017-05-20: 50 ug via INTRAVENOUS
  Administered 2017-05-20: 25 ug via INTRAVENOUS
  Administered 2017-05-20: 100 ug via INTRAVENOUS
  Administered 2017-05-20: 50 ug via INTRAVENOUS
  Administered 2017-05-20: 25 ug via INTRAVENOUS

## 2017-05-20 MED ORDER — DOCUSATE SODIUM 100 MG PO CAPS
100.0000 mg | ORAL_CAPSULE | Freq: Two times a day (BID) | ORAL | Status: DC
  Administered 2017-05-20: 100 mg via ORAL
  Filled 2017-05-20: qty 1

## 2017-05-20 MED ORDER — FLUTICASONE PROPIONATE 50 MCG/ACT NA SUSP
1.0000 | Freq: Every day | NASAL | Status: DC | PRN
Start: 2017-05-20 — End: 2017-05-21
  Filled 2017-05-20: qty 16

## 2017-05-20 MED ORDER — ROCURONIUM BROMIDE 100 MG/10ML IV SOLN
INTRAVENOUS | Status: DC | PRN
  Administered 2017-05-20: 70 mg via INTRAVENOUS

## 2017-05-20 MED ORDER — ACETAMINOPHEN 325 MG PO TABS: 650.0000 mg | ORAL_TABLET | ORAL | Status: DC | PRN

## 2017-05-20 MED ORDER — MUPIROCIN 2 % EX OINT
1.0000 "application " | TOPICAL_OINTMENT | Freq: Once | CUTANEOUS | Status: AC
  Administered 2017-05-20: 1 via TOPICAL

## 2017-05-20 MED ORDER — KETOROLAC TROMETHAMINE 30 MG/ML IJ SOLN: 30.0000 mg | Freq: Once | INTRAMUSCULAR | Status: DC | PRN

## 2017-05-20 MED ORDER — CLONAZEPAM 0.5 MG PO TABS: 1.0000 mg | ORAL_TABLET | Freq: Three times a day (TID) | ORAL | Status: DC | PRN

## 2017-05-20 MED ORDER — DEXTROSE 5 % IV SOLN
3.0000 g | INTRAVENOUS | Status: AC
  Administered 2017-05-20: 3 g via INTRAVENOUS
  Filled 2017-05-20: qty 3000

## 2017-05-20 MED ORDER — BACITRACIN ZINC 500 UNIT/GM EX OINT
TOPICAL_OINTMENT | CUTANEOUS | Status: AC
  Filled 2017-05-20: qty 28.35

## 2017-05-20 MED ORDER — BACITRACIN ZINC 500 UNIT/GM EX OINT
TOPICAL_OINTMENT | CUTANEOUS | Status: DC | PRN
  Administered 2017-05-20: 1 via TOPICAL

## 2017-05-20 MED ORDER — FENTANYL CITRATE (PF) 250 MCG/5ML IJ SOLN
INTRAMUSCULAR | Status: AC
  Filled 2017-05-20: qty 5

## 2017-05-20 MED ORDER — SUGAMMADEX SODIUM 500 MG/5ML IV SOLN
INTRAVENOUS | Status: AC
  Filled 2017-05-20: qty 5

## 2017-05-20 SURGICAL SUPPLY — 54 items
APL SKNCLS STERI-STRIP NONHPOA (GAUZE/BANDAGES/DRESSINGS) ×1
BAG DECANTER FOR FLEXI CONT (MISCELLANEOUS) ×3 IMPLANT
BENZOIN TINCTURE PRP APPL 2/3 (GAUZE/BANDAGES/DRESSINGS) ×3 IMPLANT
BLADE CLIPPER SURG (BLADE) IMPLANT
BUR MATCHSTICK NEURO 3.0 LAGG (BURR) ×3 IMPLANT
BUR PRECISION FLUTE 6.0 (BURR) ×3 IMPLANT
CANISTER SUCT 3000ML PPV (MISCELLANEOUS) ×3 IMPLANT
CARTRIDGE OIL MAESTRO DRILL (MISCELLANEOUS) ×1 IMPLANT
CLOSURE WOUND 1/2 X4 (GAUZE/BANDAGES/DRESSINGS) ×1
DIFFUSER DRILL AIR PNEUMATIC (MISCELLANEOUS) ×3 IMPLANT
DRAPE LAPAROTOMY 100X72X124 (DRAPES) ×3 IMPLANT
DRAPE MICROSCOPE LEICA (MISCELLANEOUS) ×3 IMPLANT
DRAPE POUCH INSTRU U-SHP 10X18 (DRAPES) ×3 IMPLANT
DRAPE SURG 17X23 STRL (DRAPES) ×12 IMPLANT
ELECT BLADE 4.0 EZ CLEAN MEGAD (MISCELLANEOUS) ×3
ELECT REM PT RETURN 9FT ADLT (ELECTROSURGICAL) ×3
ELECTRODE BLDE 4.0 EZ CLN MEGD (MISCELLANEOUS) ×1 IMPLANT
ELECTRODE REM PT RTRN 9FT ADLT (ELECTROSURGICAL) ×1 IMPLANT
GAUZE SPONGE 4X4 12PLY STRL (GAUZE/BANDAGES/DRESSINGS) ×3 IMPLANT
GAUZE SPONGE 4X4 16PLY XRAY LF (GAUZE/BANDAGES/DRESSINGS) IMPLANT
GLOVE BIO SURGEON STRL SZ8 (GLOVE) ×3 IMPLANT
GLOVE BIO SURGEON STRL SZ8.5 (GLOVE) ×3 IMPLANT
GLOVE BIOGEL PI IND STRL 7.5 (GLOVE) IMPLANT
GLOVE BIOGEL PI INDICATOR 7.5 (GLOVE) ×2
GLOVE EXAM NITRILE LRG STRL (GLOVE) IMPLANT
GLOVE EXAM NITRILE XL STR (GLOVE) IMPLANT
GLOVE EXAM NITRILE XS STR PU (GLOVE) IMPLANT
GLOVE SS BIOGEL STRL SZ 7 (GLOVE) IMPLANT
GLOVE SUPERSENSE BIOGEL SZ 7 (GLOVE) ×4
GOWN STRL REUS W/ TWL LRG LVL3 (GOWN DISPOSABLE) IMPLANT
GOWN STRL REUS W/ TWL XL LVL3 (GOWN DISPOSABLE) ×1 IMPLANT
GOWN STRL REUS W/TWL 2XL LVL3 (GOWN DISPOSABLE) IMPLANT
GOWN STRL REUS W/TWL LRG LVL3 (GOWN DISPOSABLE)
GOWN STRL REUS W/TWL XL LVL3 (GOWN DISPOSABLE) ×3
KIT BASIN OR (CUSTOM PROCEDURE TRAY) ×3 IMPLANT
KIT ROOM TURNOVER OR (KITS) ×3 IMPLANT
NDL HYPO 21X1.5 SAFETY (NEEDLE) IMPLANT
NEEDLE HYPO 21X1.5 SAFETY (NEEDLE) IMPLANT
NEEDLE HYPO 22GX1.5 SAFETY (NEEDLE) ×3 IMPLANT
NS IRRIG 1000ML POUR BTL (IV SOLUTION) ×3 IMPLANT
OIL CARTRIDGE MAESTRO DRILL (MISCELLANEOUS) ×3
PACK LAMINECTOMY NEURO (CUSTOM PROCEDURE TRAY) ×3 IMPLANT
PAD ARMBOARD 7.5X6 YLW CONV (MISCELLANEOUS) ×9 IMPLANT
PATTIES SURGICAL .5 X1 (DISPOSABLE) IMPLANT
RUBBERBAND STERILE (MISCELLANEOUS) ×6 IMPLANT
SPONGE SURGIFOAM ABS GEL SZ50 (HEMOSTASIS) ×3 IMPLANT
STRIP CLOSURE SKIN 1/2X4 (GAUZE/BANDAGES/DRESSINGS) ×2 IMPLANT
SUT VIC AB 1 CT1 18XBRD ANBCTR (SUTURE) ×1 IMPLANT
SUT VIC AB 1 CT1 8-18 (SUTURE) ×3
SUT VIC AB 2-0 CP2 18 (SUTURE) ×3 IMPLANT
TAPE CLOTH SURG 4X10 WHT LF (GAUZE/BANDAGES/DRESSINGS) ×2 IMPLANT
TOWEL GREEN STERILE (TOWEL DISPOSABLE) ×3 IMPLANT
TOWEL GREEN STERILE FF (TOWEL DISPOSABLE) ×3 IMPLANT
WATER STERILE IRR 1000ML POUR (IV SOLUTION) ×3 IMPLANT

## 2017-05-20 NOTE — H&P (Signed)
Subjective: The patient is a 41 year old white male who's had 2 previous ruptured lumbar disc. He has developed  an acute left foot drop. He was worked up with a lumbar MRI. This demonstrated a large hernia disc at L4-5 bilaterally, left greater than right. I discussed the various treatment options with the patient. He has decided to proceed with surgery.  Past Medical History:  Diagnosis Date  . Anxiety   . Complication of anesthesia    Woke up during surgeries, last surgery  took 1.5 hours to awaken  . 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 History of kidney stones    passed  . History of seasonal allergies     Past Surgical History:  Procedure Laterality Date  . arthroscopic knee surgery    . BACK SURGERY  2007ish   lumbar laminectomy L5- S1  . COLONOSCOPY    . ESOPHAGOGASTRODUODENOSCOPY    . KNEE SURGERY Left    left knee w/ screw  . LUMBAR LAMINECTOMY/DECOMPRESSION MICRODISCECTOMY Left 04/15/2014   Procedure: LUMBAR LAMINECTOMY/DECOMPRESSION MICRODISCECTOMY 1 LEVEL;  Surgeon: [name], MD;  Location: [facility];  Service: Neurosurgery;  Laterality: Left;  Left Lumbar Four-Five Microdiskectomy    Allergies  Allergen Reactions  . Aleve [Naproxen Sodium] Other (See Comments)    Makes his skin feel like it's crawling    Social History  Substance Use Topics  . Smoking status: Never Smoker  . Smokeless tobacco: Never Used  . Alcohol use Yes     Comment: socially    Family History  Problem Relation Age of Onset  . Hypertension Mother   . Pulmonary embolism Father    Prior to Admission medications   Medication Sig Start Date End Date Taking? Authorizing Provider  carisoprodol (SOMA) 350 MG tablet Take 350 mg by mouth at bedtime as needed for muscle spasms.  05/15/17  Yes [provider]  clonazePAM (KLONOPIN) 1 MG tablet Take 1 mg by mouth at bedtime as needed for anxiety.   Yes [provider]  escitalopram (LEXAPRO)  10 MG tablet Take 10 mg by mouth daily. 05/06/17  Yes [provider]  fexofenadine (ALLEGRA) 180 MG tablet Take 180 mg by mouth daily.   Yes [provider]  fluticasone (FLONASE) 50 MCG/ACT nasal spray Place 1 spray into both nostrils daily as needed for allergies or rhinitis.   Yes [provider]  loratadine (CLARITIN) 10 MG tablet Take 10 mg by mouth every evening.   Yes [provider]  oxyCODONE-acetaminophen (PERCOCET/ROXICET) 5-325 MG tablet Take 1 tablet by mouth every 6 (six) hours as needed for severe pain.   Yes [provider]     Review of Systems  Positive ROS: As above  All other systems have been reviewed and were otherwise negative with the exception of those mentioned in the HPI and as above.  Objective: Vital signs in last 24 hours: Temp:  [98.8 F (37.1 C)] 98.8 F (37.1 C) (06/04 1357) Pulse Rate:  [71] 71 (06/04 1357) Resp:  [18] 18 (06/04 1357) BP: (144)/(106) 144/106 (06/04 1357) SpO2:  [97 %] 97 % (06/04 1357) Weight:  [132.5 kg (292 lb)] 132.5 kg (292 lb) (06/04 1357)  General Appearance: Alert Head: Normocephalic, without obvious abnormality, atraumatic Eyes: PERRL, conjunctiva/corneas clear, EOM's intact,    Ears: Normal  Throat: Normal  Neck: Supple, Back: unremarkable, his lumbar incision is well-healed. Lungs: Clear to auscultation bilaterally, respirations unlabored Heart: Regular rate and  rhythm, no murmur, rub or gallop Abdomen: Soft, non-tender Extremities: Extremities normal, atraumatic, no cyanosis or edema Skin: unremarkable  NEUROLOGIC:   Mental status: alert and oriented,Motor Exam - the patient's left EHL dorsiflexor strength is 0-1/5. Sensory Exam - he has numbness in left L5 distribution Reflexes:  Coordination - grossly normal Gait - grossly normal Balance - grossly normal Cranial Nerves: I: smell Not tested  II: visual acuity  OS: Normal  OD: Normal   II: visual fields Full to  confrontation  II: pupils Equal, round, reactive to light  III,VII: ptosis None  III,IV,VI: extraocular muscles  Full ROM  V: mastication Normal  V: facial light touch sensation  Normal  V,VII: corneal reflex  Present  VII: facial muscle function - upper  Normal  VII: facial muscle function - lower Normal  VIII: hearing Not tested  IX: soft palate elevation  Normal  IX,X: gag reflex Present  XI: trapezius strength  5/5  XI: sternocleidomastoid strength 5/5  XI: neck flexion strength  5/5  XII: tongue strength  Normal    Data Review Lab Results  Component Value Date   WBC 7.8 05/20/2017   HGB 16.0 05/20/2017   HCT 45.0 05/20/2017   MCV 94.3 05/20/2017   PLT 209 05/20/2017   No results found for: NA, K, CL, CO2, BUN, CREATININE, GLUCOSE No results found for: INR, PROTIME  Assessment/Plan: L4-5 disc, lumbago, lumbar radiculopathy, left foot drop: I have discussed the situation with the patient and reviewed his MRI with him. We have discussed the various treatment options regarding surgery. I recommended a bilateral L4-5 discectomy. I have described the surgery to him. I have shown him surgical models. We have discussed the risks, benefits, alternatives, expected postoperative course, and likelihood of achieving our goals with surgery. I have answered all his questions. He has decided to proceed with surgery.   [name] 05/20/2017 3:39 PM

## 2017-05-20 NOTE — Transfer of Care (Signed)
Immediate Anesthesia Transfer of Care Note  Patient: Luis Roberts  Procedure(s) Performed: Procedure(s) with comments: LUMBAR LAMINECTOMY/DECOMPRESSION MICRODISCECTOMY LUMBAR FOUR- LUMBAR FIVE (N/A) - LUMBAR LAMINECTOMY/DECOMPRESSION MICRODISCECTOMY LUMBAR 4- LUMBAR 5  Patient Location: PACU  Anesthesia Type:General  Level of Consciousness: awake, alert  and oriented  Airway & Oxygen Therapy: Patient Spontanous Breathing and Patient connected to nasal cannula oxygen  Post-op Assessment: Report given to RN and Post -op Vital signs reviewed and stable  Post vital signs: Reviewed and stable  Last Vitals:  Vitals:   05/20/17 1357 05/20/17 1915  BP: (!) 144/106   Pulse: 71   Resp: 18   Temp: 37.1 C (P) 36.6 C    Last Pain:  Vitals:   05/20/17 1915  TempSrc:   PainSc: (P) Asleep      Patients Stated Pain Goal: 5 (31/42/76 7011)  Complications: No apparent anesthesia complications

## 2017-05-20 NOTE — Anesthesia Procedure Notes (Signed)
Procedure Name: Intubation Date/Time: 05/20/2017 4:33 PM Performed by: Barrington Ellison Pre-anesthesia Checklist: Patient identified, Emergency Drugs available, Suction available and Patient being monitored Patient Re-evaluated:Patient Re-evaluated prior to inductionOxygen Delivery Method: Circle System Utilized Preoxygenation: Pre-oxygenation with 100% oxygen Intubation Type: IV induction Ventilation: Mask ventilation without difficulty Laryngoscope Size: Mac and 4 Grade View: Grade I Tube type: Oral Tube size: 7.5 mm Number of attempts: 1 Airway Equipment and Method: Stylet and Oral airway Placement Confirmation: ETT inserted through vocal cords under direct vision,  positive ETCO2 and breath sounds checked- equal and bilateral Secured at: 22 cm Tube secured with: Tape Dental Injury: Teeth and Oropharynx as per pre-operative assessment

## 2017-05-20 NOTE — Anesthesia Preprocedure Evaluation (Signed)
Anesthesia Evaluation  Patient identified by MRN, date of birth, ID band Patient awake    Reviewed: Allergy & Precautions, NPO status , Patient's Chart, lab work & pertinent test results  Airway Mallampati: II  TM Distance: >3 FB Neck ROM: Full    Dental no notable dental hx.    Pulmonary neg pulmonary ROS,    Pulmonary exam normal breath sounds clear to auscultation       Cardiovascular negative cardio ROS Normal cardiovascular exam Rhythm:Regular Rate:Normal     Neuro/Psych negative neurological ROS  negative psych ROS   GI/Hepatic negative GI ROS, Neg liver ROS,   Endo/Other  negative endocrine ROS  Renal/GU negative Renal ROS  negative genitourinary   Musculoskeletal negative musculoskeletal ROS (+)   Abdominal   Peds negative pediatric ROS (+)  Hematology negative hematology ROS (+)   Anesthesia Other Findings   Reproductive/Obstetrics negative OB ROS                             Anesthesia Physical Anesthesia Plan  ASA: II  Anesthesia Plan: General   Post-op Pain Management:    Induction: Intravenous  Airway Management Planned: Oral ETT  Additional Equipment:   Intra-op Plan:   Post-operative Plan: Extubation in OR  Informed Consent: I have reviewed the patients History and Physical, chart, labs and discussed the procedure including the risks, benefits and alternatives for the proposed anesthesia with the patient or authorized representative who has indicated his/her understanding and acceptance.   Dental advisory given  Plan Discussed with: CRNA and Surgeon  Anesthesia Plan Comments:         Anesthesia Quick Evaluation

## 2017-05-20 NOTE — Op Note (Signed)
Brief history: The patient is a 41 year old white male who has had several prior back surgeries. He developed acute back and left greater than  right leg pain and acute left foot drop. He was worked up with a lumbar MRI which demonstrated a large herniated disc at L4-5 bilaterally. I discussed the various treatment options with the patient including surgery. He has weighed the risks, benefits, and alternative surgery and decided proceed with a bilateral L4-5 redo discectomy.  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 Earle Gell  Asst.: Dr. Annette Stable  Anesthesia: Gen. endotracheal  Estimated blood loss: 125 mL  Drains: None  Complications: None  Description of procedure: The patient was brought to the operating room by the anesthesia team. General endotracheal anesthesia was induced. The patient was turned to the prone position on the Wilson frame. The patient's lumbosacral region was then prepared with Betadine scrub and Betadine solution. Sterile drapes were applied.  I then injected the area to be incised with Marcaine with epinephrine solution. I then used a scalpel to make a linear midline incision over the L4-5 intervertebral disc space. I then used electrocautery to perform a bilateral sided subperiosteal dissection exposing the spinous process and lamina of L4-5. We obtained intraoperative radiograph to confirm our location. I then inserted the [facility] retractor for exposure.  We then brought the operative microscope into the field. Under its magnification and illumination we completed the microdissection. I used a high-speed drill to perform a laminotomy at L4-5 bilaterally, drilling in a cephalad direction until we encountered some relatively non-scarred dura. I then used a Kerrison punches to  widen the laminotomy and removed the epidural scar tissue at L4-5 bilaterally. We then used microdissection to free up the thecal sac and the bilateral L5 nerve root from the epidural tissue. I then used a Kerrison punch to perform a foraminotomy at about the bilateral L5 nerve root. We then using the nerve root retractor to gently retract the thecal sac and the left L5 nerve root medially. This exposed the intervertebral disc which was quite scarred in. We identified the ruptured disc and remove it with the pituitary forceps. We repeated this procedure at L4-5 and a right decompressing the right L5 nerve root as well.  I then palpated along the ventral surface of the thecal sac and along exit route of the bilateral L5 nerve root and noted that the neural structures were well decompressed. This completed the decompression.  We then obtained hemostasis using bipolar electrocautery. We irrigated the wound out with bacitracin solution. We then removed the retractor. We then reapproximated the patient's thoracolumbar fascia with interrupted #1 Vicryl suture. We then reapproximated the patient's subcutaneous tissue with interrupted 2-0 Vicryl suture. We then reapproximated patient's skin with Steri-Strips and benzoin. The was then coated with bacitracin ointment. The drapes were removed. The patient was subsequently returned to the supine position where they were extubated by the anesthesia team. The patient was then transported to the postanesthesia care unit in stable condition. All sponge instrument and needle counts were reportedly correct at the end of this case.

## 2017-05-21 ENCOUNTER — Encounter (HOSPITAL_COMMUNITY): Payer: Self-pay | Admitting: Neurosurgery

## 2017-05-21 DIAGNOSIS — M5116 Intervertebral disc disorders with radiculopathy, lumbar region: Secondary | ICD-10-CM | POA: Diagnosis not present

## 2017-05-21 LAB — GLUCOSE, CAPILLARY: Glucose-Capillary: 144 mg/dL — ABNORMAL HIGH (ref 65–99)

## 2017-05-21 LAB — CBC
HEMATOCRIT: 43.1 % (ref 39.0–52.0)
Hemoglobin: 15 g/dL (ref 13.0–17.0)
MCH: 33 pg (ref 26.0–34.0)
MCHC: 34.8 g/dL (ref 30.0–36.0)
MCV: 94.9 fL (ref 78.0–100.0)
Platelets: 234 10*3/uL (ref 150–400)
RBC: 4.54 MIL/uL (ref 4.22–5.81)
RDW: 12.4 % (ref 11.5–15.5)
WBC: 9.9 10*3/uL (ref 4.0–10.5)

## 2017-05-21 LAB — BASIC METABOLIC PANEL
Anion gap: 9 (ref 5–15)
BUN: 15 mg/dL (ref 6–20)
CALCIUM: 9 mg/dL (ref 8.9–10.3)
CO2: 28 mmol/L (ref 22–32)
CREATININE: 1.15 mg/dL (ref 0.61–1.24)
Chloride: 99 mmol/L — ABNORMAL LOW (ref 101–111)
GFR calc non Af Amer: >60 mL/min (ref >60)
Glucose, Bld: 171 mg/dL — ABNORMAL HIGH (ref 65–99)
Potassium: 4.2 mmol/L (ref 3.5–5.1)
Sodium: 136 mmol/L (ref 135–145)

## 2017-05-21 MED ORDER — OXYCODONE HCL 15 MG PO TABS: 15.0000 mg | ORAL_TABLET | ORAL | 0 refills | Status: AC | PRN

## 2017-05-21 MED ORDER — CYCLOBENZAPRINE HCL 10 MG PO TABS
10.0000 mg | ORAL_TABLET | Freq: Three times a day (TID) | ORAL | 1 refills | Status: AC | PRN
Start: 2017-05-21 — End: ?

## 2017-05-21 MED ORDER — DOCUSATE SODIUM 100 MG PO CAPS: 100.0000 mg | ORAL_CAPSULE | Freq: Two times a day (BID) | ORAL | 0 refills | Status: DC

## 2017-05-21 NOTE — Discharge Summary (Signed)
Physician Discharge Summary  Patient ID: Luis Roberts MRN: [id number] DOB/AGE: [birth year] 41 y.o.  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 The surgery went well.  The patient's postoperative course was unremarkable. On postoperative day #1 he requested discharge home. He was given written and oral discharge instructions. All his questions were answered.  Consults: None Significant Diagnostic Studies: None Treatments: Redo bilateral L4-5 discectomy using microdissection. Discharge Exam: Blood pressure 121/73, pulse 91, temperature 98.6 F (37 C), temperature source Oral, resp. rate (!) 22, height 6\' 5"  (1.956 m), weight 132.5 kg (292 lb), SpO2 98 %. The patient is alert and pleasant. He looks well. His strength is normal except his left EHL/dorsiflexors are 2-3/5, i.e. improved.  Disposition: Home  Discharge Instructions    Call MD for:  difficulty breathing, headache or visual disturbances    Complete by:  As directed    Call MD for:  extreme fatigue    Complete by:  As directed    Call MD for:  hives    Complete by:  As directed    Call MD for:  persistant dizziness or light-headedness    Complete by:  As directed    Call MD for:  persistant nausea and vomiting    Complete by:  As directed    Call MD for:  redness, tenderness, or signs of infection (pain, swelling, redness, odor or green/yellow discharge around incision site)    Complete by:  As directed    Call MD for:  severe uncontrolled pain    Complete by:  As directed    Call MD for:  temperature >100.4    Complete by:  As directed    Diet - low sodium heart healthy    Complete by:  As directed    Discharge instructions     Complete by:  As directed    Call [phone] for a followup appointment. Take a stool softener while you are using pain medications.   Driving Restrictions    Complete by:  As directed    Do not drive for 2 weeks.   Increase activity slowly    Complete by:  As directed    Lifting restrictions    Complete by:  As directed    Do not lift more than 5 pounds. No excessive bending or twisting.   May shower / Bathe    Complete by:  As directed    He may shower after the pain she is removed 3 days after surgery. Leave the incision alone.   Remove dressing in 48 hours    Complete by:  As directed    Your stitches are under the scan and will dissolve by themselves. The Steri-Strips will fall off after you take a few showers. Do not rub back or pick at the wound, Leave the wound alone.     Allergies as of 05/21/2017      Reactions   Aleve [naproxen Sodium] Other (See Comments)   Makes his skin feel like it's crawling      Medication List    STOP taking these medications   carisoprodol 350 MG tablet Commonly known as:  SOMA   oxyCODONE-acetaminophen 5-325 MG tablet Commonly known as:  PERCOCET/ROXICET     TAKE these  medications   clonazePAM 1 MG tablet Commonly known as:  KLONOPIN Take 1 mg by mouth at bedtime as needed for anxiety.   cyclobenzaprine 10 MG tablet Commonly known as:  FLEXERIL Take 1 tablet (10 mg total) by mouth 3 (three) times daily as needed for muscle spasms.   docusate sodium 100 MG capsule Commonly known as:  COLACE Take 1 capsule (100 mg total) by mouth 2 (two) times daily.   escitalopram 10 MG tablet Commonly known as:  LEXAPRO Take 10 mg by mouth daily.   fexofenadine 180 MG tablet Commonly known as:  ALLEGRA Take 180 mg by mouth daily.   fluticasone 50 MCG/ACT nasal spray Commonly known as:  FLONASE Place 1 spray into both nostrils daily as needed for allergies or rhinitis.   loratadine 10 MG tablet Commonly known as:  CLARITIN Take 10 mg by  mouth every evening.   oxyCODONE 15 MG immediate release tablet Commonly known as:  ROXICODONE Take 1 tablet (15 mg total) by mouth every 4 (four) hours as needed for moderate pain.        SignedOphelia Charter 05/21/2017, 7:42 AM

## 2017-05-21 NOTE — Evaluation (Signed)
Physical Therapy Evaluation Patient Details Name: Luis Roberts MRN: [id number] DOB: 1976/03/15 Today's Date: 05/21/2017   History of Present Illness  Pt is a 41 y/o male who presents s/p redo of L4-L5 microdiscectomy on 05/20/17.  Clinical Impression  Patient evaluated by Physical Therapy with no further acute PT needs identified. All education has been completed and the patient has no further questions. At the time of PT eval noted continued L foot drop during mobility. Pt was grossly at a mod I level for transfers however could benefit from light supervision during longer stretches of ambulation due to occasional toe drag/trips with distracted walking. Will defer PT follow up to MD, however pt reports he already has planned to see a PT on an outpatient basis when able. See below for any follow-up Physical Therapy or equipment needs. PT is signing off. Thank you for this referral.     Follow Up Recommendations No PT follow up    Equipment Recommendations  None recommended by PT    Recommendations for Other Services       Precautions / Restrictions Precautions Precautions: Fall;Back Precaution Booklet Issued: Yes (comment) Precaution Comments: Reviewed handout in detail. Pt was cued for precautions during functional mobility.  Required Braces or Orthoses:  (No brace needed order) Restrictions Weight Bearing Restrictions: No      Mobility  Bed Mobility               General bed mobility comments: Pt received walking in hall  Transfers Overall transfer level: Modified independent Equipment used: None             General transfer comment: Pt demonstrated proper hand placement and safety awareness.   Ambulation/Gait Ambulation/Gait assistance: Supervision Ambulation Distance (Feet): 400 Feet Assistive device: None Gait Pattern/deviations: Decreased dorsiflexion - left;Step-through pattern;Antalgic Gait velocity: Decreased Gait velocity interpretation: Below normal  speed for age/gender General Gait Details: Increased effort to clear floor on the L with occasional toe drag and mild trip. Pt able to recover without assist. Noted a narrow BOS with increased frequency of toe drag with distraction of talking.   Stairs Stairs: Yes Stairs assistance: Modified independent (Device/Increase time) Stair Management: Two rails;Alternating pattern;Forwards Number of Stairs: 10 General stair comments: Pt completed without difficulty. VC's for sequencing and general safety.   Wheelchair Mobility    Modified Rankin (Stroke Patients Only)       Balance Overall balance assessment: Needs assistance Sitting-balance support: No upper extremity supported;Feet supported Sitting balance-Leahy Scale: Good     Standing balance support: No upper extremity supported;During functional activity Standing balance-Leahy Scale: Sickles Standing balance comment: No assist required, however instability noted with distracted dynamic standing activity.                              Pertinent Vitals/Pain Pain Assessment: Faces Faces Pain Scale: Hurts a little bit Pain Location: Incision site Pain Descriptors / Indicators: Operative site guarding Pain Intervention(s): Limited activity within patient's tolerance;Monitored during session;Repositioned    Home Living Family/patient expects to be discharged to:: Private residence Living Arrangements: Spouse/significant other Available Help at Discharge: Family Type of Home: House Home Access: Stairs to enter Entrance Stairs-Rails: Right;Left;Can reach both Technical brewer of Steps: 3 Home Layout: One level        Prior Function Level of Independence: Independent               Hand Dominance   Dominant Hand: Right  Extremity/Trunk Assessment   Upper Extremity Assessment Upper Extremity Assessment: Overall WFL for tasks assessed    Lower Extremity Assessment Lower Extremity Assessment: LLE  deficits/detail LLE Deficits / Details: Noted L foot drop consistent with pre-op diagnosis.  LLE Sensation: decreased light touch (Pt reported numbness) LLE Coordination: decreased gross motor;decreased fine motor    Cervical / Trunk Assessment Cervical / Trunk Assessment: Other exceptions Cervical / Trunk Exceptions: s/p surgery  Communication   Communication: No difficulties  Cognition Arousal/Alertness: Awake/alert Behavior During Therapy: WFL for tasks assessed/performed Overall Cognitive Status: Within Functional Limits for tasks assessed                                        General Comments      Exercises     Assessment/Plan    PT Assessment Patent does not need any further PT services  PT Problem List         PT Treatment Interventions      PT Goals (Current goals can be found in the Care Plan section)  Acute Rehab PT Goals Patient Stated Goal: Home today PT Goal Formulation: All assessment and education complete, DC therapy    Frequency     Barriers to discharge        Co-evaluation               AM-PAC PT "6 Clicks" Daily Activity  Outcome Measure Difficulty turning over in bed (including adjusting bedclothes, sheets and blankets)?: None Difficulty moving from lying on back to sitting on the side of the bed? : None Difficulty sitting down on and standing up from a chair with arms (e.g., wheelchair, bedside commode, etc,.)?: None Help needed moving to and from a bed to chair (including a wheelchair)?: None Help needed walking in hospital room?: None Help needed climbing 3-5 steps with a railing? : None 6 Click Score: 24    End of Session   Activity Tolerance: Patient tolerated treatment well Patient left: in chair;with call bell/phone within reach;with family/visitor present Nurse Communication: Mobility status PT Visit Diagnosis: Other symptoms and signs involving the nervous system (R29.898);Pain Pain - part of body:   (Back)    Time: 6270-3500 PT Time Calculation (min) (ACUTE ONLY): 20 min   Charges:   PT Evaluation $PT Eval Moderate Complexity: 1 Procedure     PT G Codes:   PT G-Codes **NOT FOR INPATIENT CLASS** Functional Assessment Tool Used: Clinical judgement Functional Limitation: Mobility: Walking and moving around Mobility: Walking and Moving Around Current Status (X3818): At least 1 percent but less than 20 percent impaired, limited or restricted Mobility: Walking and Moving Around Goal Status [phone]): At least 1 percent but less than 20 percent impaired, limited or restricted Mobility: Walking and Moving Around Discharge Status [phone]): At least 1 percent but less than 20 percent impaired, limited or restricted    Rolinda Roan, PT, DPT Acute Rehabilitation Services Pager: [place] 05/21/2017, 10:05 AM

## 2017-05-21 NOTE — Progress Notes (Signed)
Discharge instructions reviewed with patient/family. RXs given. All questions answered at this time. Family to transport.   Ave Filter, RN

## 2017-05-22 NOTE — Anesthesia Postprocedure Evaluation (Signed)
Anesthesia Post Note  Patient: ANMOL FLECK  Procedure(s) Performed: Procedure(s) (LRB): LUMBAR LAMINECTOMY/DECOMPRESSION MICRODISCECTOMY LUMBAR FOUR- LUMBAR FIVE (N/A)     Patient location during evaluation: PACU Anesthesia Type: General Level of consciousness: awake and alert and patient cooperative Pain management: pain level controlled Vital Signs Assessment: post-procedure vital signs reviewed and stable Respiratory status: spontaneous breathing and respiratory function stable Cardiovascular status: stable Anesthetic complications: no    Last Vitals:  Vitals:   05/21/17 0400 05/21/17 0820  BP: 121/73 (!) 152/97  Pulse: 91 (!) 103  Resp: (!) 22 20  Temp: 37 C 36.9 C    Last Pain:  Vitals:   05/21/17 0848  TempSrc:   PainSc: 4                  Donovyn Guidice S

## 2017-05-28 ENCOUNTER — Inpatient Hospital Stay (HOSPITAL_COMMUNITY)
Admission: EM | Admit: 2017-05-28 | Discharge: 2017-05-30 | DRG: 300 | Disposition: A | Discharge status: Home / Self Care | Payer: 59 | Attending: Internal Medicine | Admitting: Internal Medicine

## 2017-05-28 ENCOUNTER — Emergency Department (HOSPITAL_COMMUNITY): Payer: 59

## 2017-05-28 ENCOUNTER — Encounter (HOSPITAL_COMMUNITY): Payer: Self-pay

## 2017-05-28 DIAGNOSIS — I82492 Acute embolism and thrombosis of other specified deep vein of left lower extremity: Secondary | ICD-10-CM | POA: Diagnosis present

## 2017-05-28 DIAGNOSIS — M7989 Other specified soft tissue disorders: Secondary | ICD-10-CM

## 2017-05-28 DIAGNOSIS — I2699 Other pulmonary embolism without acute cor pulmonale: Secondary | ICD-10-CM

## 2017-05-28 DIAGNOSIS — Z8249 Family history of ischemic heart disease and other diseases of the circulatory system: Secondary | ICD-10-CM

## 2017-05-28 DIAGNOSIS — J181 Lobar pneumonia, unspecified organism: Secondary | ICD-10-CM

## 2017-05-28 DIAGNOSIS — R0602 Shortness of breath: Secondary | ICD-10-CM

## 2017-05-28 DIAGNOSIS — J189 Pneumonia, unspecified organism: Secondary | ICD-10-CM

## 2017-05-28 DIAGNOSIS — Z9889 Other specified postprocedural states: Secondary | ICD-10-CM

## 2017-05-28 DIAGNOSIS — T81718A Complication of other artery following a procedure, not elsewhere classified, initial encounter: Secondary | ICD-10-CM | POA: Diagnosis not present

## 2017-05-28 DIAGNOSIS — J302 Other seasonal allergic rhinitis: Secondary | ICD-10-CM | POA: Diagnosis present

## 2017-05-28 DIAGNOSIS — W1830XA Fall on same level, unspecified, initial encounter: Secondary | ICD-10-CM | POA: Diagnosis present

## 2017-05-28 DIAGNOSIS — M79662 Pain in left lower leg: Secondary | ICD-10-CM

## 2017-05-28 DIAGNOSIS — M5136 Other intervertebral disc degeneration, lumbar region: Secondary | ICD-10-CM | POA: Diagnosis present

## 2017-05-28 DIAGNOSIS — T8172XA Complication of vein following a procedure, not elsewhere classified, initial encounter: Secondary | ICD-10-CM | POA: Diagnosis present

## 2017-05-28 DIAGNOSIS — F419 Anxiety disorder, unspecified: Secondary | ICD-10-CM | POA: Diagnosis present

## 2017-05-28 DIAGNOSIS — Z79899 Other long term (current) drug therapy: Secondary | ICD-10-CM

## 2017-05-28 DIAGNOSIS — R0902 Hypoxemia: Secondary | ICD-10-CM | POA: Diagnosis present

## 2017-05-28 DIAGNOSIS — M21372 Foot drop, left foot: Secondary | ICD-10-CM | POA: Diagnosis present

## 2017-05-28 DIAGNOSIS — Z886 Allergy status to analgesic agent status: Secondary | ICD-10-CM

## 2017-05-28 LAB — CBC WITH DIFFERENTIAL/PLATELET
Basophils Absolute: 0 10*3/uL (ref 0.0–0.1)
Basophils Relative: 0 %
EOS ABS: 0.1 10*3/uL (ref 0.0–0.7)
Eosinophils Relative: 1 %
HEMATOCRIT: 41.2 % (ref 39.0–52.0)
HEMOGLOBIN: 14.7 g/dL (ref 13.0–17.0)
LYMPHS ABS: 2.7 10*3/uL (ref 0.7–4.0)
Lymphocytes Relative: 30 %
MCH: 33.4 pg (ref 26.0–34.0)
MCHC: 35.7 g/dL (ref 30.0–36.0)
MCV: 93.6 fL (ref 78.0–100.0)
MONOS PCT: 10 %
Monocytes Absolute: 0.9 10*3/uL (ref 0.1–1.0)
NEUTROS PCT: 59 %
Neutro Abs: 5.4 10*3/uL (ref 1.7–7.7)
Platelets: 188 10*3/uL (ref 150–400)
RBC: 4.4 MIL/uL (ref 4.22–5.81)
RDW: 11.9 % (ref 11.5–15.5)
WBC: 9 10*3/uL (ref 4.0–10.5)

## 2017-05-28 LAB — D-DIMER, QUANTITATIVE: D-Dimer, Quant: 3.89 ug/mL-FEU — ABNORMAL HIGH (ref 0.00–0.50)

## 2017-05-28 LAB — BASIC METABOLIC PANEL
Anion gap: 7 (ref 5–15)
BUN: 10 mg/dL (ref 6–20)
CHLORIDE: 98 mmol/L — AB (ref 101–111)
CO2: 26 mmol/L (ref 22–32)
CREATININE: 1.06 mg/dL (ref 0.61–1.24)
Calcium: 8.8 mg/dL — ABNORMAL LOW (ref 8.9–10.3)
GFR calc Af Amer: >60 mL/min (ref >60)
GFR calc non Af Amer: >60 mL/min (ref >60)
GLUCOSE: 120 mg/dL — AB (ref 65–99)
Potassium: 3.7 mmol/L (ref 3.5–5.1)
Sodium: 131 mmol/L — ABNORMAL LOW (ref 135–145)

## 2017-05-28 LAB — I-STAT TROPONIN, ED: Troponin i, poc: 0 ng/mL (ref 0.00–0.08)

## 2017-05-28 LAB — PROTIME-INR
INR: 1.19
Prothrombin Time: 15.1 seconds (ref 11.4–15.2)

## 2017-05-28 LAB — APTT: APTT: 40 s — AB (ref 24–36)

## 2017-05-28 MED ORDER — IOPAMIDOL (ISOVUE-370) INJECTION 76%
INTRAVENOUS | Status: AC
  Administered 2017-05-28: 100 mL
  Filled 2017-05-28: qty 100

## 2017-05-28 NOTE — ED Notes (Signed)
Patient transported to CT 

## 2017-05-28 NOTE — ED Provider Notes (Signed)
[facility] DEPT Provider Note   CSN: [id number] Arrival date & time: 05/28/17  1915     History   Chief Complaint Chief Complaint  Patient presents with  . Leg Pain  . Shortness of Breath    HPI Luis Roberts is a 41 y.o. male.  HPI    Luis Roberts is a 41 y.o. male, with a history of Recent spinal surgery, presenting to the ED with left lower leg pain beginning June 10. Pain has been worsening since then. Associated calf swelling and erythema. Current lower extremity pain is aching, 3/10, radiating superiorly. Much worse with palpation and ambulation. Endorses shortness of breath on exertion beginning June 8. Patient sustained a mechanical fall on June 6, landing on his right elbow. Patient was evaluated for this at an UC with no acute findings on imaging. Denies head trauma, LOC, neck pain. Had a L4-5 laminectomy performed by Dr. Arnoldo Morale on June 4. Patient had left foot drop before and after surgery.  Denies CP, cough, fever/chills, new neuro deficits,   Denies history of PE/DVT or active cancer. Family history of fatal PE in patient's father.   Past Medical History:  Diagnosis Date  . Anxiety   . Complication of anesthesia    Woke up during surgeries, last surgery  took 1.5 hours to awaken  . 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 History of kidney stones    passed  . History of seasonal allergies     Patient Active Problem List   Diagnosis Date Noted  . Pulmonary embolism ([place]) 05/29/2017  . Lumbar herniated disc 04/15/2014    Past Surgical History:  Procedure Laterality Date  . arthroscopic knee surgery    . BACK SURGERY  2007ish   lumbar laminectomy L5- S1  . COLONOSCOPY    . ESOPHAGOGASTRODUODENOSCOPY    . KNEE SURGERY Left    left knee w/ screw  . LUMBAR LAMINECTOMY/DECOMPRESSION MICRODISCECTOMY Left 04/15/2014   Procedure: LUMBAR LAMINECTOMY/DECOMPRESSION MICRODISCECTOMY 1 LEVEL;  Surgeon: [name], MD;  Location:  [facility];  Service: Neurosurgery;  Laterality: Left;  Left Lumbar Four-Five Microdiskectomy  . LUMBAR LAMINECTOMY/DECOMPRESSION MICRODISCECTOMY N/A 05/20/2017   Procedure: LUMBAR LAMINECTOMY/DECOMPRESSION MICRODISCECTOMY LUMBAR FOUR- LUMBAR FIVE;  Surgeon: Newman Pies, MD;  Location: [facility];  Service: Neurosurgery;  Laterality: N/A;  LUMBAR LAMINECTOMY/DECOMPRESSION MICRODISCECTOMY LUMBAR 4- LUMBAR 5       Home Medications    Prior to Admission medications   Medication Sig Start Date End Date Taking? Authorizing Provider  clonazePAM (KLONOPIN) 1 MG tablet Take 1 mg by mouth at bedtime as needed for anxiety.   Yes [provider]  cyclobenzaprine (FLEXERIL) 10 MG tablet Take 1 tablet (10 mg total) by mouth 3 (three) times daily as needed for muscle spasms. 05/21/17  Yes Newman Pies, MD  docusate sodium (COLACE) 100 MG capsule Take 1 capsule (100 mg total) by mouth 2 (two) times daily. Patient taking differently: Take 100 mg by mouth 2 (two) times daily as needed for mild constipation.  05/21/17  Yes Newman Pies, MD  escitalopram (LEXAPRO) 10 MG tablet Take 10 mg by mouth daily. 05/06/17  Yes [provider]  fexofenadine (ALLEGRA) 180 MG tablet Take 180 mg by mouth daily.   Yes [provider]  fluticasone (FLONASE) 50 MCG/ACT nasal spray Place 1 spray into both nostrils daily as needed for allergies or rhinitis.   Yes [provider]  loratadine (CLARITIN) 10 MG tablet  Take 10 mg by mouth daily as needed for allergies.    Yes [provider]  oxyCODONE (ROXICODONE) 15 MG immediate release tablet Take 1 tablet (15 mg total) by mouth every 4 (four) hours as needed for moderate pain. 05/21/17  Yes Newman Pies, MD    Family History Family History  Problem Relation Age of Onset  . Hypertension Mother   . Pulmonary embolism Father     Social History Social History  Substance Use Topics  . Smoking status: Never Smoker  . Smokeless  tobacco: Never Used  . Alcohol use Yes     Comment: socially     Allergies   Aleve [naproxen sodium]   Review of Systems Review of Systems  Constitutional: Negative for chills, diaphoresis and fever.  Respiratory: Positive for shortness of breath. Negative for cough.   Cardiovascular: Positive for leg swelling. Negative for chest pain.  Gastrointestinal: Negative for abdominal pain, nausea and vomiting.  Musculoskeletal: Positive for myalgias.  Neurological: Negative for dizziness, syncope, weakness, light-headedness, numbness and headaches.  All other systems reviewed and are negative.    Physical Exam Updated Vital Signs BP (!) 138/99   Pulse (!) 102   Temp 98.8 F (37.1 C) (Oral)   Resp (!) 24   Ht 6\' 5"  (1.956 m)   Wt 131.1 kg (289 lb)   SpO2 99%   BMI 34.27 kg/m   Physical Exam  Constitutional: He appears well-developed and well-nourished. No distress.  HENT:  Head: Normocephalic and atraumatic.  Eyes: Conjunctivae are normal.  Neck: Neck supple.  Cardiovascular: Regular rhythm, normal heart sounds and intact distal pulses.  Tachycardia present.   Mild tachycardia  Pulmonary/Chest: Effort normal and breath sounds normal. No respiratory distress.  No increased work of breathing at rest. Patient speaks in full sentences without difficulty.  Abdominal: Soft. There is no tenderness. There is no guarding.  Musculoskeletal: He exhibits edema and tenderness. He exhibits no deformity.  Exquisite tenderness along deep venous distribution to left calf with associated swelling and erythema.   Lymphadenopathy:    He has no cervical adenopathy.  Neurological: He is alert.  Skin: Skin is warm and dry. He is not diaphoretic.  Psychiatric: He has a normal mood and affect. His behavior is normal.  Nursing note and vitals reviewed.    ED Treatments / Results  Labs (all labs ordered are listed, but only abnormal results are displayed) Labs Reviewed  BASIC METABOLIC  PANEL - Abnormal; Notable for the following:       Result Value   Sodium 131 (*)    Chloride 98 (*)    Glucose, Bld 120 (*)    Calcium 8.8 (*)    All other components within normal limits  D-DIMER, QUANTITATIVE (NOT AT [facility]) - Abnormal; Notable for the following:    D-Dimer, Quant 3.89 (*)    All other components within normal limits  APTT - Abnormal; Notable for the following:    aPTT 40 (*)    All other components within normal limits  HEPATIC FUNCTION PANEL - Abnormal; Notable for the following:    Total Bilirubin 1.5 (*)    Indirect Bilirubin 1.2 (*)    All other components within normal limits  CBC WITH DIFFERENTIAL/PLATELET  PROTIME-INR  HEPARIN LEVEL (UNFRACTIONATED)  Randolm Idol, ED    EKG  EKG Interpretation  Date/Time:  Tuesday May 28 2017 22:58:22 EDT Ventricular Rate:  101 PR Interval:    QRS Duration: 105 QT Interval:  345 QTC  Calculation: 448 R Axis:   54 Text Interpretation:  Sinus tachycardia Confirmed by Thayer Jew [phone]) on 05/28/2017 11:04:39 PM       Radiology Ct Angio Chest Pe W Or Wo Contrast  Result Date: 05/29/2017 CLINICAL DATA:  Acute onset of shortness of breath and generalized chest pain. Left leg swelling. Initial encounter. EXAM: CT ANGIOGRAPHY CHEST WITH CONTRAST TECHNIQUE: Multidetector CT imaging of the chest was performed using the standard protocol during bolus administration of intravenous contrast. Multiplanar CT image reconstructions and MIPs were obtained to evaluate the vascular anatomy. CONTRAST:  80 mL of Isovue 370 IV contrast COMPARISON:  None. FINDINGS: Cardiovascular: There is pulmonary embolus within pulmonary arteries to the lower lobes bilaterally. Evaluation for pulmonary embolus is somewhat suboptimal due to limitations in the timing of the contrast bolus. The RV/LV ratio is just below 0.9, borderline normal. The heart remains normal in size. The thoracic aorta is grossly unremarkable. The great vessels are within  normal limits. No calcific atherosclerotic disease is seen. Mediastinum/Nodes: No mediastinal lymphadenopathy is seen. No pericardial effusion is identified. The visualized portions of the thyroid gland are unremarkable. No axillary lymphadenopathy is appreciated. Lungs/Pleura: Mild patchy airspace opacities at the central aspect of the right lung raise concern for infection. No pleural effusion or pneumothorax is seen. No masses are identified. Upper Abdomen: The visualized portions of the liver and spleen are unremarkable. Musculoskeletal: No acute osseous abnormalities are identified. The visualized musculature is unremarkable in appearance. Review of the MIP images confirms the above findings. IMPRESSION: 1. Pulmonary embolus within pulmonary arteries to the lower lobes bilaterally. RV/LV ratio is borderline normal, just below 0.9. 2. Mild patchy airspace opacities at the central aspect of the right lung raise concern for pneumonia. Critical Value/emergent results were called by telephone at the time of interpretation on 05/29/2017 at 12:20 am to Dr. Arlean Hopping, who verbally acknowledged these results. Electronically Signed   By: Garald Balding M.D.   On: 05/29/2017 00:21    Procedures Procedures (including critical care time)  Medications Ordered in ED Medications  cefTRIAXone (ROCEPHIN) 1 g in dextrose 5 % 50 mL IVPB (not administered)  azithromycin (ZITHROMAX) 500 mg in dextrose 5 % 250 mL IVPB (not administered)  heparin ADULT infusion 100 units/mL (25000 units/275mL sodium chloride 0.45%) (2,000 Units/hr Intravenous New Bag/Given 05/29/17 0055)  iopamidol (ISOVUE-370) 76 % injection (100 mLs  Contrast Given 05/28/17 2344)  0.9 %  sodium chloride infusion (125 mL/hr Intravenous New Bag/Given 05/29/17 0055)  heparin bolus via infusion 6,000 Units (6,000 Units Intravenous Bolus from Bag 05/29/17 0056)     Initial Impression / Assessment and Plan / ED Course  I have reviewed the triage vital signs  and the nursing notes.  Pertinent labs & imaging results that were available during my care of the patient were reviewed by me and considered in my medical decision making (see chart for details).  Clinical Course as of May 29 56  Tue May 28, 2017  2331 Patient was offered pain management, but declined.  [SJ]  Wed May 29, 2017  0049 Spoke with Dr. Hal Hope, hospitalist, who agreed to admit the patient.  [SJ]    Clinical Course User Index [SJ] Orma Cheetham C, PA-C    Patient presents with shortness of breath and left lower extremity pain and swelling. Concern for PE and/or DVT. Postop pneumonia also a possibility. Patient shows no signs of distress at rest.  CT shows signs of bilateral lower lobe PEs without evidence of  right heart strain. Evidence of right lower lobe pneumonia. Low suspicion for sepsis. Chest x-ray obtained to facilitate serial exams for tracking of pneumonia.  Patient tachypneic and dyspneic on exertion. Admission for continued management.    Findings and plan of care discussed with Thayer Jew, MD.   Vitals:   05/28/17 2315 05/28/17 2330 05/29/17 0000 05/29/17 0015  BP: (!) 135/97 (!) 125/98 138/88 (!) 135/94  Pulse: 100 (!) 102 99 (!) 102  Resp: (!) 22 (!) 22 15 17   Temp:      TempSrc:      SpO2: 100% 100% 99% 99%  Weight:      Height:          Final Clinical Impressions(s) / ED Diagnoses   Final diagnoses:  Shortness of breath  Recent major surgery  Pain and swelling of left lower leg  Other acute pulmonary embolism without acute cor pulmonale ([place])  Community acquired pneumonia of right lower lobe of lung [facility])    New Prescriptions New Prescriptions   No medications on file     Layla Maw 05/29/17 0057    Merryl Hacker, MD 06/01/17 [phone]

## 2017-05-28 NOTE — ED Triage Notes (Signed)
S/p lumbar laminectomy 05-20-17.  Pt fell 05-22-17 outside while walking, was seen at u/c re: right elbow.  Onset 05-26-17 pt started not feeling well, shortness of breath, pain and swelling in left calf.  Drop foot to left foot still not improved after surgery.  Pt talking in complete sentences, NAD at triage.

## 2017-05-29 ENCOUNTER — Observation Stay (HOSPITAL_BASED_OUTPATIENT_CLINIC_OR_DEPARTMENT_OTHER): Payer: 59

## 2017-05-29 ENCOUNTER — Observation Stay (HOSPITAL_COMMUNITY): Payer: 59

## 2017-05-29 ENCOUNTER — Encounter (HOSPITAL_COMMUNITY): Payer: Self-pay | Admitting: Internal Medicine

## 2017-05-29 DIAGNOSIS — Z79899 Other long term (current) drug therapy: Secondary | ICD-10-CM | POA: Diagnosis not present

## 2017-05-29 DIAGNOSIS — M5136 Other intervertebral disc degeneration, lumbar region: Secondary | ICD-10-CM | POA: Diagnosis present

## 2017-05-29 DIAGNOSIS — Z8249 Family history of ischemic heart disease and other diseases of the circulatory system: Secondary | ICD-10-CM | POA: Diagnosis not present

## 2017-05-29 DIAGNOSIS — I2782 Chronic pulmonary embolism: Secondary | ICD-10-CM | POA: Diagnosis not present

## 2017-05-29 DIAGNOSIS — I2699 Other pulmonary embolism without acute cor pulmonale: Secondary | ICD-10-CM | POA: Diagnosis present

## 2017-05-29 DIAGNOSIS — F419 Anxiety disorder, unspecified: Secondary | ICD-10-CM | POA: Diagnosis present

## 2017-05-29 DIAGNOSIS — R0602 Shortness of breath: Secondary | ICD-10-CM | POA: Diagnosis present

## 2017-05-29 DIAGNOSIS — M21372 Foot drop, left foot: Secondary | ICD-10-CM | POA: Diagnosis present

## 2017-05-29 DIAGNOSIS — M79605 Pain in left leg: Secondary | ICD-10-CM

## 2017-05-29 DIAGNOSIS — R2242 Localized swelling, mass and lump, left lower limb: Secondary | ICD-10-CM | POA: Diagnosis not present

## 2017-05-29 DIAGNOSIS — Z886 Allergy status to analgesic agent status: Secondary | ICD-10-CM | POA: Diagnosis not present

## 2017-05-29 DIAGNOSIS — M79662 Pain in left lower leg: Secondary | ICD-10-CM | POA: Diagnosis not present

## 2017-05-29 DIAGNOSIS — W1830XA Fall on same level, unspecified, initial encounter: Secondary | ICD-10-CM | POA: Diagnosis present

## 2017-05-29 DIAGNOSIS — I82492 Acute embolism and thrombosis of other specified deep vein of left lower extremity: Secondary | ICD-10-CM | POA: Diagnosis not present

## 2017-05-29 DIAGNOSIS — Z9889 Other specified postprocedural states: Secondary | ICD-10-CM | POA: Diagnosis not present

## 2017-05-29 DIAGNOSIS — T81718A Complication of other artery following a procedure, not elsewhere classified, initial encounter: Secondary | ICD-10-CM | POA: Diagnosis present

## 2017-05-29 DIAGNOSIS — J302 Other seasonal allergic rhinitis: Secondary | ICD-10-CM | POA: Diagnosis present

## 2017-05-29 DIAGNOSIS — R0902 Hypoxemia: Secondary | ICD-10-CM | POA: Diagnosis present

## 2017-05-29 DIAGNOSIS — T8172XA Complication of vein following a procedure, not elsewhere classified, initial encounter: Secondary | ICD-10-CM | POA: Diagnosis present

## 2017-05-29 DIAGNOSIS — M7989 Other specified soft tissue disorders: Secondary | ICD-10-CM | POA: Diagnosis not present

## 2017-05-29 LAB — CBC
HCT: 38.3 % — ABNORMAL LOW (ref 39.0–52.0)
Hemoglobin: 13.6 g/dL (ref 13.0–17.0)
MCH: 33.4 pg (ref 26.0–34.0)
MCHC: 35.5 g/dL (ref 30.0–36.0)
MCV: 94.1 fL (ref 78.0–100.0)
PLATELETS: 193 10*3/uL (ref 150–400)
RBC: 4.07 MIL/uL — AB (ref 4.22–5.81)
RDW: 11.9 % (ref 11.5–15.5)
WBC: 8.1 10*3/uL (ref 4.0–10.5)

## 2017-05-29 LAB — BASIC METABOLIC PANEL
Anion gap: 8 (ref 5–15)
BUN: 7 mg/dL (ref 6–20)
CHLORIDE: 96 mmol/L — AB (ref 101–111)
CO2: 27 mmol/L (ref 22–32)
Calcium: 8.4 mg/dL — ABNORMAL LOW (ref 8.9–10.3)
Creatinine, Ser: 1 mg/dL (ref 0.61–1.24)
GFR calc Af Amer: >60 mL/min (ref >60)
GFR calc non Af Amer: >60 mL/min (ref >60)
Glucose, Bld: 116 mg/dL — ABNORMAL HIGH (ref 65–99)
Potassium: 3.5 mmol/L (ref 3.5–5.1)
Sodium: 131 mmol/L — ABNORMAL LOW (ref 135–145)

## 2017-05-29 LAB — HEPARIN LEVEL (UNFRACTIONATED)
HEPARIN UNFRACTIONATED: 0.42 [IU]/mL (ref 0.30–0.70)
Heparin Unfractionated: 0.34 IU/mL (ref 0.30–0.70)

## 2017-05-29 LAB — TROPONIN I

## 2017-05-29 LAB — LACTIC ACID, PLASMA: LACTIC ACID, VENOUS: 1.1 mmol/L (ref 0.5–1.9)

## 2017-05-29 LAB — HEPATIC FUNCTION PANEL
ALK PHOS: 51 U/L (ref 38–126)
ALT: 35 U/L (ref 17–63)
AST: 37 U/L (ref 15–41)
Albumin: 3.5 g/dL (ref 3.5–5.0)
BILIRUBIN DIRECT: 0.3 mg/dL (ref 0.1–0.5)
BILIRUBIN INDIRECT: 1.2 mg/dL — AB (ref 0.3–0.9)
BILIRUBIN TOTAL: 1.5 mg/dL — AB (ref 0.3–1.2)
Total Protein: 6.7 g/dL (ref 6.5–8.1)

## 2017-05-29 LAB — HIV ANTIBODY (ROUTINE TESTING W REFLEX): HIV Screen 4th Generation wRfx: NONREACTIVE

## 2017-05-29 LAB — ECHOCARDIOGRAM COMPLETE
Height: 77 in
Weight: 4547.2 oz

## 2017-05-29 LAB — BRAIN NATRIURETIC PEPTIDE: B Natriuretic Peptide: 4 pg/mL (ref 0.0–100.0)

## 2017-05-29 LAB — PROCALCITONIN: Procalcitonin: <0.1 ng/mL

## 2017-05-29 MED ORDER — SODIUM CHLORIDE 0.9 % IV SOLN
INTRAVENOUS | Status: DC
  Administered 2017-05-29: 04:00:00 via INTRAVENOUS

## 2017-05-29 MED ORDER — ESCITALOPRAM OXALATE 10 MG PO TABS
10.0000 mg | ORAL_TABLET | Freq: Every day | ORAL | Status: DC
  Administered 2017-05-29 – 2017-05-30 (×2): 10 mg via ORAL
  Filled 2017-05-29 (×2): qty 1

## 2017-05-29 MED ORDER — DEXTROSE 5 % IV SOLN
500.0000 mg | Freq: Once | INTRAVENOUS | Status: AC
  Administered 2017-05-29: 500 mg via INTRAVENOUS
  Filled 2017-05-29: qty 500

## 2017-05-29 MED ORDER — ONDANSETRON HCL 4 MG/2ML IJ SOLN: 4.0000 mg | Freq: Four times a day (QID) | INTRAMUSCULAR | Status: DC | PRN

## 2017-05-29 MED ORDER — CLONAZEPAM 0.5 MG PO TABS: 1.0000 mg | ORAL_TABLET | Freq: Every evening | ORAL | Status: DC | PRN

## 2017-05-29 MED ORDER — ONDANSETRON HCL 4 MG PO TABS: 4.0000 mg | ORAL_TABLET | Freq: Four times a day (QID) | ORAL | Status: DC | PRN

## 2017-05-29 MED ORDER — DEXTROSE 5 % IV SOLN
1.0000 g | Freq: Once | INTRAVENOUS | Status: AC
  Administered 2017-05-29: 1 g via INTRAVENOUS
  Filled 2017-05-29: qty 10

## 2017-05-29 MED ORDER — OXYCODONE HCL 5 MG PO TABS: 15.0000 mg | ORAL_TABLET | ORAL | Status: DC | PRN

## 2017-05-29 MED ORDER — HEPARIN (PORCINE) IN NACL 100-0.45 UNIT/ML-% IJ SOLN
2300.0000 [IU]/h | INTRAMUSCULAR | Status: AC
  Administered 2017-05-29 (×2): 2000 [IU]/h via INTRAVENOUS
  Administered 2017-05-30: 2050 [IU]/h via INTRAVENOUS
  Filled 2017-05-29 (×2): qty 250

## 2017-05-29 MED ORDER — CYCLOBENZAPRINE HCL 10 MG PO TABS: 10.0000 mg | ORAL_TABLET | Freq: Three times a day (TID) | ORAL | Status: DC | PRN

## 2017-05-29 MED ORDER — LORATADINE 10 MG PO TABS
10.0000 mg | ORAL_TABLET | Freq: Every day | ORAL | Status: DC
  Administered 2017-05-29 – 2017-05-30 (×2): 10 mg via ORAL
  Filled 2017-05-29 (×2): qty 1

## 2017-05-29 MED ORDER — SODIUM CHLORIDE 0.9 % IV SOLN
Freq: Once | INTRAVENOUS | Status: AC
  Administered 2017-05-29: 125 mL/h via INTRAVENOUS

## 2017-05-29 MED ORDER — ACETAMINOPHEN 650 MG RE SUPP: 650.0000 mg | Freq: Four times a day (QID) | RECTAL | Status: DC | PRN

## 2017-05-29 MED ORDER — HEPARIN BOLUS VIA INFUSION
6000.0000 [IU] | Freq: Once | INTRAVENOUS | Status: AC
  Administered 2017-05-29: 6000 [IU] via INTRAVENOUS
  Filled 2017-05-29: qty 6000

## 2017-05-29 MED ORDER — DOCUSATE SODIUM 100 MG PO CAPS: 100.0000 mg | ORAL_CAPSULE | Freq: Two times a day (BID) | ORAL | Status: DC | PRN

## 2017-05-29 MED ORDER — ACETAMINOPHEN 325 MG PO TABS: 650.0000 mg | ORAL_TABLET | Freq: Four times a day (QID) | ORAL | Status: DC | PRN

## 2017-05-29 MED ORDER — FLUTICASONE PROPIONATE 50 MCG/ACT NA SUSP: 1.0000 | Freq: Every day | NASAL | Status: DC | PRN

## 2017-05-29 MED ORDER — ONDANSETRON HCL 4 MG/2ML IJ SOLN
4.0000 mg | Freq: Once | INTRAMUSCULAR | Status: AC
  Administered 2017-05-29: 4 mg via INTRAVENOUS

## 2017-05-29 NOTE — ED Notes (Signed)
hospitalist at the bedside 

## 2017-05-29 NOTE — Progress Notes (Signed)
VASCULAR LAB PRELIMINARY  PRELIMINARY  PRELIMINARY  PRELIMINARY  Bilateral lower extremity venous duplex completed.    Preliminary report:  Positive for acute DVT in the left gastrocnemius vein. There is no evidence of superficial thrombosis. Also noted is a possible Baker's cyst. Right:  No evidence of DVT, superficial thrombosis, or Baker's cyst.  Kaleeah Gingerich, RVS 05/29/2017, 10:15 AM

## 2017-05-29 NOTE — Progress Notes (Signed)
Patient admitted midnight with PE/DVT-- please see H&P.  On heparin.  Spoke with neurosurgery  As patient 9 days out from laminectomy.  Ok with anticoagulation.  Fell 5 days ago but denies trauma to left leg.  Denies being lss active than usual.  +family hx of PE after surgery.  Duplex + for LLE DVT.  Await benefits check and echo results.  Eulogio Bear DO

## 2017-05-29 NOTE — Progress Notes (Signed)
Pt does not have a high-low bed today due to no impulsive behavior, compliance with safety plan and lack of available high-low beds in the hospital at this time.

## 2017-05-29 NOTE — Progress Notes (Signed)
RE: Benefit check  Received: Today  Message Contents  Memory Argue CMA        # 2. S/W FELICIA @ [facility] # [phone]   1. ELIQUIS 5 MG BID   COVER- YES  CO-PAY- $ 45.00  PRIOR APPROVAL- YES -# [phone]   2. XARELTO 15 MG BID   COVER- YES  CO-PAY- $ 45.00  PRIOR APPROVAL- YES # [phone]    MAIL-ORDER FOR 90 DAYS SUPPLY $90.00   PHARMACY : [facility]

## 2017-05-29 NOTE — Progress Notes (Addendum)
ANTICOAGULATION CONSULT NOTE - Initial Consult  Pharmacy Consult for Heparin Indication: pulmonary embolus  Allergies  Allergen Reactions  . Aleve [Naproxen Sodium] Other (See Comments)    Makes his skin feel like it's crawling    Patient Measurements: Height: 6\' 5"  (195.6 cm) Weight: 284 lb 3.2 oz (128.9 kg) IBW/kg (Calculated) : 89.1 Heparin Dosing Weight: 120 kg  Vital Signs: Temp: 98.8 F (37.1 C) (06/13 1244) Temp Source: Oral (06/13 1244) BP: 122/71 (06/13 1244) Pulse Rate: 91 (06/13 1244)  Labs:  Recent Labs  05/28/17 2300 05/28/17 2306 05/29/17 0324 05/29/17 0719 05/29/17 1329  HGB 14.7  --  13.6  --   --   HCT 41.2  --  38.3*  --   --   PLT 188  --  193  --   --   APTT  --  40*  --   --   --   LABPROT  --  15.1  --   --   --   INR  --  1.19  --   --   --   HEPARINUNFRC  --   --   --  0.42 0.34  CREATININE 1.06  --  1.00  --   --   TROPONINI  --   --  <0.03  --   --     Estimated Creatinine Clearance: 144.4 mL/min (by C-G formula based on SCr of 1 mg/dL).   Medical History: Past Medical History:  Diagnosis Date  . Anxiety   . Complication of anesthesia    Woke up during surgeries, last surgery  took 1.5 hours to awaken  . 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 History of kidney stones    passed  . History of seasonal allergies     Assessment: 41 y.o. male with new PE on IV heparin, heparin level 0.42, therapeutic on 2000 units/hr. CBC stable, No bleeding noted per chart.  Goal of Therapy:  Heparin level 0.3-0.7 units/ml Monitor platelets by anticoagulation protocol: Yes   Plan:  Continue heparin infusion 2000 units/hr Check confirmatory heparin level in 6 hours at 1330 Daily heparin level and CBC F/u plans for oral anticoagulation.  Maryanna Shape, PharmD, BCPS  Clinical Pharmacist  Pager: [phone]   05/29/2017,4:07 PM   Addendum: Confirmatory heparin level 0.34, low-end therapeutic range.  Increase rate  slightly to 2050 units/hr to stay in therapeutic range  Maryanna Shape, PharmD, BCPS  Clinical Pharmacist  Pager: [phone]

## 2017-05-29 NOTE — Progress Notes (Signed)
ANTICOAGULATION CONSULT NOTE - Initial Consult  Pharmacy Consult for Heparin Indication: pulmonary embolus  Allergies  Allergen Reactions  . Aleve [Naproxen Sodium] Other (See Comments)    Makes his skin feel like it's crawling    Patient Measurements: Height: 6\' 5"  (195.6 cm) Weight: 284 lb 3.2 oz (128.9 kg) IBW/kg (Calculated) : 89.1 Heparin Dosing Weight: 120 kg  Vital Signs: Temp: 98.5 F (36.9 C) (06/13 0217) Temp Source: Oral (06/13 0217) BP: 126/94 (06/13 0217) Pulse Rate: 107 (06/13 0217)  Labs:  Recent Labs  05/28/17 2300 05/28/17 2306 05/29/17 0324 05/29/17 0719  HGB 14.7  --  13.6  --   HCT 41.2  --  38.3*  --   PLT 188  --  193  --   APTT  --  40*  --   --   LABPROT  --  15.1  --   --   INR  --  1.19  --   --   HEPARINUNFRC  --   --   --  0.42  CREATININE 1.06  --  1.00  --   TROPONINI  --   --  <0.03  --     Estimated Creatinine Clearance: 144.4 mL/min (by C-G formula based on SCr of 1 mg/dL).   Medical History: Past Medical History:  Diagnosis Date  . Anxiety   . Complication of anesthesia    Woke up during surgeries, last surgery  took 1.5 hours to awaken  . 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 History of kidney stones    passed  . History of seasonal allergies     Assessment: 40 y.o. male with new PE on IV heparin, heparin level 0.42, therapeutic on 2000 units/hr. CBC stable, No bleeding noted per chart.  Goal of Therapy:  Heparin level 0.3-0.7 units/ml Monitor platelets by anticoagulation protocol: Yes   Plan:  Continue heparin infusion 2000 units/hr Check confirmatory heparin level in 6 hours at 1330 Daily heparin level and CBC F/u plans for oral anticoagulation.  Maryanna Shape, PharmD, BCPS  Clinical Pharmacist  Pager: [phone]   05/29/2017,8:38 AM

## 2017-05-29 NOTE — Progress Notes (Addendum)
Patient educated on falls safety plan. Patient informed that he is a high falls risk since he recently fell , gait balance and current medication. Patient educated on how to call for help with call light and via phone before getting out of bed.  Both patient and wife verbalized understanding. Bed alarm activated, yellow bracelet placed on patient wrist and yellow socks applied to bilateral feet.Falls safety plan signed by wife.

## 2017-05-29 NOTE — Progress Notes (Signed)
Left calf measures at 17.75" at 1430 on 05/29/2017.

## 2017-05-29 NOTE — Progress Notes (Signed)
  Echocardiogram 2D Echocardiogram has been performed.  Donata Clay 05/29/2017, 1:18 PM

## 2017-05-29 NOTE — Progress Notes (Signed)
ANTICOAGULATION CONSULT NOTE - Initial Consult  Pharmacy Consult for Heparin Indication: pulmonary embolus  Allergies  Allergen Reactions  . Aleve [Naproxen Sodium] Other (See Comments)    Makes his skin feel like it's crawling    Patient Measurements: Height: 6\' 5"  (195.6 cm) Weight: 289 lb (131.1 kg) IBW/kg (Calculated) : 89.1 Heparin Dosing Weight: 120 kg  Vital Signs: Temp: 98.8 F (37.1 C) (06/12 2157) Temp Source: Oral (06/12 2157) BP: 135/94 (06/13 0015) Pulse Rate: 102 (06/13 0015)  Labs:  Recent Labs  05/28/17 2300 05/28/17 2306  HGB 14.7  --   HCT 41.2  --   PLT 188  --   APTT  --  40*  LABPROT  --  15.1  INR  --  1.19  CREATININE 1.06  --     Estimated Creatinine Clearance: 137.4 mL/min (by C-G formula based on SCr of 1.06 mg/dL).   Medical History: Past Medical History:  Diagnosis Date  . Anxiety   . Complication of anesthesia    Woke up during surgeries, last surgery  took 1.5 hours to awaken  . 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 History of kidney stones    passed  . History of seasonal allergies     Medications:  No current facility-administered medications on file prior to encounter.    Current Outpatient Prescriptions on File Prior to Encounter  Medication Sig Dispense Refill  . clonazePAM (KLONOPIN) 1 MG tablet Take 1 mg by mouth at bedtime as needed for anxiety.    . cyclobenzaprine (FLEXERIL) 10 MG tablet Take 1 tablet (10 mg total) by mouth 3 (three) times daily as needed for muscle spasms. 50 tablet 1  . docusate sodium (COLACE) 100 MG capsule Take 1 capsule (100 mg total) by mouth 2 (two) times daily. 60 capsule 0  . escitalopram (LEXAPRO) 10 MG tablet Take 10 mg by mouth daily.  6  . fexofenadine (ALLEGRA) 180 MG tablet Take 180 mg by mouth daily.    . fluticasone (FLONASE) 50 MCG/ACT nasal spray Place 1 spray into both nostrils daily as needed for allergies or rhinitis.    Marland Kitchen loratadine (CLARITIN)  10 MG tablet Take 10 mg by mouth every evening.    Marland Kitchen oxyCODONE (ROXICODONE) 15 MG immediate release tablet Take 1 tablet (15 mg total) by mouth every 4 (four) hours as needed for moderate pain. 40 tablet 0     Assessment: 41 y.o. male with PE for heparin  Goal of Therapy:  Heparin level 0.3-0.7 units/ml Monitor platelets by anticoagulation protocol: Yes   Plan:  Heparin 6000 units IV bolus, then start heparin 2000 units/hr Check heparin level in 6 hours.    Caryl Pina 05/29/2017,12:28 AM

## 2017-05-29 NOTE — H&P (Signed)
History and Physical    SHANKAR SILBER CBS:[id number] DOB: May 08, 1976 DOA: 05/28/2017  PCP: Carlus Pavlov, MD  Patient coming from: Home.  Chief Complaint: Shortness of breath.  HPI: Luis Roberts is a 41 y.o. male with history of anxiety who has had recent low back surgery and discharge 1 week ago presents to the ER with complaints of shortness of breath. Patient states that after discharge patient noticed increasing swelling in the lower extremity more on the left side. Last couple of days patient has been having exertional shortness of breath. Denies any fever or chills or productive cough.   ED Course: On exam patient has left leg swelling. CT angiogram of the chest shows bilateral lower lobe pulmonary embolism. Borderline strain pattern. Patient was easily getting hypoxic on walking so has been admitted for further observation. Patient was started on heparin.  Review of Systems: As per HPI, rest all negative.   Past Medical History:  Diagnosis Date  . Anxiety   . Complication of anesthesia    Woke up during surgeries, last surgery  took 1.5 hours to awaken  . 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 History of kidney stones    passed  . History of seasonal allergies     Past Surgical History:  Procedure Laterality Date  . arthroscopic knee surgery    . BACK SURGERY  2007ish   lumbar laminectomy L5- S1  . COLONOSCOPY    . ESOPHAGOGASTRODUODENOSCOPY    . KNEE SURGERY Left    left knee w/ screw  . LUMBAR LAMINECTOMY/DECOMPRESSION MICRODISCECTOMY Left 04/15/2014   Procedure: LUMBAR LAMINECTOMY/DECOMPRESSION MICRODISCECTOMY 1 LEVEL;  Surgeon: [name], MD;  Location: [facility];  Service: Neurosurgery;  Laterality: Left;  Left Lumbar Four-Five Microdiskectomy  . LUMBAR LAMINECTOMY/DECOMPRESSION MICRODISCECTOMY N/A 05/20/2017   Procedure: LUMBAR LAMINECTOMY/DECOMPRESSION MICRODISCECTOMY LUMBAR FOUR- LUMBAR FIVE;  Surgeon: Newman Pies, MD;   Location: [facility];  Service: Neurosurgery;  Laterality: N/A;  LUMBAR LAMINECTOMY/DECOMPRESSION MICRODISCECTOMY LUMBAR 4- LUMBAR 5     reports that he has never smoked. He has never used smokeless tobacco. He reports that he drinks alcohol. He reports that he does not use drugs.  Allergies  Allergen Reactions  . Aleve [Naproxen Sodium] Other (See Comments)    Makes his skin feel like it's crawling    Family History  Problem Relation Age of Onset  . Hypertension Mother   . Pulmonary embolism Father     Prior to Admission medications   Medication Sig Start Date End Date Taking? Authorizing Provider  clonazePAM (KLONOPIN) 1 MG tablet Take 1 mg by mouth at bedtime as needed for anxiety.   Yes [provider]  cyclobenzaprine (FLEXERIL) 10 MG tablet Take 1 tablet (10 mg total) by mouth 3 (three) times daily as needed for muscle spasms. 05/21/17  Yes Newman Pies, MD  docusate sodium (COLACE) 100 MG capsule Take 1 capsule (100 mg total) by mouth 2 (two) times daily. Patient taking differently: Take 100 mg by mouth 2 (two) times daily as needed for mild constipation.  05/21/17  Yes Newman Pies, MD  escitalopram (LEXAPRO) 10 MG tablet Take 10 mg by mouth daily. 05/06/17  Yes [provider]  fexofenadine (ALLEGRA) 180 MG tablet Take 180 mg by mouth daily.   Yes [provider]  fluticasone (FLONASE) 50 MCG/ACT nasal spray Place 1 spray into both nostrils daily as needed for allergies or rhinitis.   Yes [provider]  loratadine (CLARITIN) 10 MG tablet Take 10 mg by mouth daily as needed for allergies.    Yes [provider]  oxyCODONE (ROXICODONE) 15 MG immediate release tablet Take 1 tablet (15 mg total) by mouth every 4 (four) hours as needed for moderate pain. 05/21/17  Yes Newman Pies, MD    Physical Exam: Vitals:   05/29/17 0045 05/29/17 0100 05/29/17 0115 05/29/17 0130  BP: 117/77 114/73 (!) 149/87 128/85  Pulse: 98 (!) 101  95    Resp: 14 (!) 24  19  Temp:      TempSrc:      SpO2: 99% 99%  99%  Weight:      Height:          Constitutional: Moderately built and nourished. Vitals:   05/29/17 0045 05/29/17 0100 05/29/17 0115 05/29/17 0130  BP: 117/77 114/73 (!) 149/87 128/85  Pulse: 98 (!) 101  95  Resp: 14 (!) 24  19  Temp:      TempSrc:      SpO2: 99% 99%  99%  Weight:      Height:       Eyes: Anicteric no pallor. ENMT: No discharge from the ears eyes nose and mouth. Neck: No mass felt. No JVD appreciated. Respiratory: No rhonchi or crepitations. Cardiovascular: S1-S2 heard no murmurs appreciated. Abdomen: Soft nontender bowel sounds present. Musculoskeletal: Left leg swelling. Skin: No rash. Neurologic: Alert awake oriented to time place and person. Moves all extremities. Psychiatric: Appears normal. Normal affect.   Labs on Admission: I have personally reviewed following labs and imaging studies  CBC:  Recent Labs Lab 05/28/17 2300  WBC 9.0  NEUTROABS 5.4  HGB 14.7  HCT 41.2  MCV 93.6  PLT 161   Basic Metabolic Panel:  Recent Labs Lab 05/28/17 2300  NA 131*  K 3.7  CL 98*  CO2 26  GLUCOSE 120*  BUN 10  CREATININE 1.06  CALCIUM 8.8*   GFR: Estimated Creatinine Clearance: 137.4 mL/min (by C-G formula based on SCr of 1.06 mg/dL). Liver Function Tests:  Recent Labs Lab 05/28/17 2306  AST 37  ALT 35  ALKPHOS 51  BILITOT 1.5*  PROT 6.7  ALBUMIN 3.5   No results for input(s): LIPASE, AMYLASE in the last 168 hours. No results for input(s): AMMONIA in the last 168 hours. Coagulation Profile:  Recent Labs Lab 05/28/17 2306  INR 1.19   Cardiac Enzymes: No results for input(s): CKTOTAL, CKMB, CKMBINDEX, TROPONINI in the last 168 hours. BNP (last 3 results) No results for input(s): PROBNP in the last 8760 hours. HbA1C: No results for input(s): HGBA1C in the last 72 hours. CBG: No results for input(s): GLUCAP in the last 168 hours. Lipid Profile: No results  for input(s): CHOL, HDL, LDLCALC, TRIG, CHOLHDL, LDLDIRECT in the last 72 hours. Thyroid Function Tests: No results for input(s): TSH, T4TOTAL, FREET4, T3FREE, THYROIDAB in the last 72 hours. Anemia Panel: No results for input(s): VITAMINB12, FOLATE, FERRITIN, TIBC, IRON, RETICCTPCT in the last 72 hours. Urine analysis: No results found for: COLORURINE, APPEARANCEUR, LABSPEC, PHURINE, GLUCOSEU, HGBUR, BILIRUBINUR, KETONESUR, PROTEINUR, UROBILINOGEN, NITRITE, LEUKOCYTESUR Sepsis Labs: @LABRCNTIP (procalcitonin:4,lacticidven:4) ) Recent Results (from the past 240 hour(s))  Surgical pcr screen     Status: None   Collection Time: 05/20/17  1:45 PM  Result Value Ref Range Status   MRSA, PCR NEGATIVE NEGATIVE Final   Staphylococcus aureus NEGATIVE NEGATIVE Final    Comment:        The Xpert SA Assay (FDA approved  for NASAL specimens in patients over 69 years of age), is one component of a comprehensive surveillance program.  Test performance has been validated by [facility] for patients greater than or equal to 64 year old. It is not intended to diagnose infection nor to guide or monitor treatment.      Radiological Exams on Admission: Dg Chest 2 View  Result Date: 05/29/2017 CLINICAL DATA:  Acute onset of shortness of breath. Initial encounter. EXAM: CHEST  2 VIEW COMPARISON:  CTA of the chest performed 05/28/2017 FINDINGS: The lungs are well-aerated and clear. There is no evidence of focal opacification, pleural effusion or pneumothorax. The heart is normal in size; the mediastinal contour is within normal limits. No acute osseous abnormalities are seen. IMPRESSION: No acute cardiopulmonary process seen. Electronically Signed   By: Garald Balding M.D.   On: 05/29/2017 01:38   Ct Angio Chest Pe W Or Wo Contrast  Result Date: 05/29/2017 CLINICAL DATA:  Acute onset of shortness of breath and generalized chest pain. Left leg swelling. Initial encounter. EXAM: CT ANGIOGRAPHY CHEST WITH  CONTRAST TECHNIQUE: Multidetector CT imaging of the chest was performed using the standard protocol during bolus administration of intravenous contrast. Multiplanar CT image reconstructions and MIPs were obtained to evaluate the vascular anatomy. CONTRAST:  80 mL of Isovue 370 IV contrast COMPARISON:  None. FINDINGS: Cardiovascular: There is pulmonary embolus within pulmonary arteries to the lower lobes bilaterally. Evaluation for pulmonary embolus is somewhat suboptimal due to limitations in the timing of the contrast bolus. The RV/LV ratio is just below 0.9, borderline normal. The heart remains normal in size. The thoracic aorta is grossly unremarkable. The great vessels are within normal limits. No calcific atherosclerotic disease is seen. Mediastinum/Nodes: No mediastinal lymphadenopathy is seen. No pericardial effusion is identified. The visualized portions of the thyroid gland are unremarkable. No axillary lymphadenopathy is appreciated. Lungs/Pleura: Mild patchy airspace opacities at the central aspect of the right lung raise concern for infection. No pleural effusion or pneumothorax is seen. No masses are identified. Upper Abdomen: The visualized portions of the liver and spleen are unremarkable. Musculoskeletal: No acute osseous abnormalities are identified. The visualized musculature is unremarkable in appearance. Review of the MIP images confirms the above findings. IMPRESSION: 1. Pulmonary embolus within pulmonary arteries to the lower lobes bilaterally. RV/LV ratio is borderline normal, just below 0.9. 2. Mild patchy airspace opacities at the central aspect of the right lung raise concern for pneumonia. Critical Value/emergent results were called by telephone at the time of interpretation on 05/29/2017 at 12:20 am to Dr. Arlean Hopping, who verbally acknowledged these results. Electronically Signed   By: Garald Balding M.D.   On: 05/29/2017 00:21    EKG: Independently reviewed. Sinus  tachycardia.  Assessment/Plan Principal Problem:   Pulmonary embolism (HCC) Active Problems:   Pulmonary embolus ([place])    1. Pulmonary embolism patient is hemodynamically stable but gets exertional shortness of breath - will closely observe overnight. Check 2-D echo. Continue IV heparin infusion. Check troponin and lactate levels and BNP. 2. Possible pneumonia - CT angiogram shows possible pneumonia. Patient is afebrile. Patient does not have any productive cough fever or chills. Patient was given ceftriaxone and Zithromax in the ER. I have ordered procalcitonin levels. If elevated may continue antibiotics otherwise may discontinue. 3. Reason low back surgery with Dr. Arnoldo Morale. 4. History of anxiety.   DVT prophylaxis: Heparin. Code Status: Full code.  Family Communication: Patient's family.  Disposition Plan: Home.  Consults called: None.  Admission status: Observation.    Rise Patience MD [facility] Pager [phone].  If 7PM-7AM, please contact night-coverage [email or web address] Password [email or web address]  05/29/2017, 2:09 AM

## 2017-05-30 DIAGNOSIS — Z9889 Other specified postprocedural states: Secondary | ICD-10-CM

## 2017-05-30 DIAGNOSIS — M7989 Other specified soft tissue disorders: Secondary | ICD-10-CM

## 2017-05-30 DIAGNOSIS — I2782 Chronic pulmonary embolism: Secondary | ICD-10-CM

## 2017-05-30 DIAGNOSIS — I82492 Acute embolism and thrombosis of other specified deep vein of left lower extremity: Secondary | ICD-10-CM

## 2017-05-30 DIAGNOSIS — M79662 Pain in left lower leg: Secondary | ICD-10-CM

## 2017-05-30 LAB — HEPARIN LEVEL (UNFRACTIONATED)
HEPARIN UNFRACTIONATED: 0.67 [IU]/mL (ref 0.30–0.70)
Heparin Unfractionated: 0.27 IU/mL — ABNORMAL LOW (ref 0.30–0.70)

## 2017-05-30 LAB — CBC
HCT: 36.2 % — ABNORMAL LOW (ref 39.0–52.0)
Hemoglobin: 12.8 g/dL — ABNORMAL LOW (ref 13.0–17.0)
MCH: 33.4 pg (ref 26.0–34.0)
MCHC: 35.4 g/dL (ref 30.0–36.0)
MCV: 94.5 fL (ref 78.0–100.0)
PLATELETS: 194 10*3/uL (ref 150–400)
RBC: 3.83 MIL/uL — AB (ref 4.22–5.81)
RDW: 12 % (ref 11.5–15.5)
WBC: 6.1 10*3/uL (ref 4.0–10.5)

## 2017-05-30 MED ORDER — RIVAROXABAN 20 MG PO TABS: 20.0000 mg | ORAL_TABLET | Freq: Every day | ORAL | Status: DC

## 2017-05-30 MED ORDER — RIVAROXABAN 15 MG PO TABS
15.0000 mg | ORAL_TABLET | Freq: Two times a day (BID) | ORAL | Status: DC
  Administered 2017-05-30: 15 mg via ORAL
  Filled 2017-05-30: qty 1

## 2017-05-30 MED ORDER — RIVAROXABAN (XARELTO) VTE STARTER PACK (15 & 20 MG): ORAL_TABLET | ORAL | 0 refills | Status: DC

## 2017-05-30 NOTE — Progress Notes (Signed)
Pt escorted to valet to meet his wife, via wheelchair accompanied by NT

## 2017-05-30 NOTE — Consult Note (Signed)
Hospital Consult    Reason for Consult:  dvt/pe Requesting Physician:  Eliseo Squires MRN #:  [id number]  History of Present Illness: This is a 41 y.o. male without personal history of dvt although father has history of pe. Recently underwent Redo bilateral L4-5  Intervertebral discectomy using micro-dissection with Dr. Arnoldo Morale. Post operative course was uneventful. Returned to [employer] with swelling in left leg. Swelling was present a couple of days and was associated with calf pain. Did not have injury to leg although has foot drop chronically. Associated shortness of breath and found to have pe in bilateral lower lobes. Now off oxygen and shortness of breath. Edema and pain of left leg improving.   Past Medical History:  Diagnosis Date  . Anxiety   . Complication of anesthesia    Woke up during surgeries, last surgery  took 1.5 hours to awaken  . 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 History of kidney stones    passed  . History of seasonal allergies     Past Surgical History:  Procedure Laterality Date  . arthroscopic knee surgery    . BACK SURGERY  2007ish   lumbar laminectomy L5- S1  . COLONOSCOPY    . ESOPHAGOGASTRODUODENOSCOPY    . KNEE SURGERY Left    left knee w/ screw  . LUMBAR LAMINECTOMY/DECOMPRESSION MICRODISCECTOMY Left 04/15/2014   Procedure: LUMBAR LAMINECTOMY/DECOMPRESSION MICRODISCECTOMY 1 LEVEL;  Surgeon: [name], MD;  Location: [facility];  Service: Neurosurgery;  Laterality: Left;  Left Lumbar Four-Five Microdiskectomy  . LUMBAR LAMINECTOMY/DECOMPRESSION MICRODISCECTOMY N/A 05/20/2017   Procedure: LUMBAR LAMINECTOMY/DECOMPRESSION MICRODISCECTOMY LUMBAR FOUR- LUMBAR FIVE;  Surgeon: Newman Pies, MD;  Location: [facility];  Service: Neurosurgery;  Laterality: N/A;  LUMBAR LAMINECTOMY/DECOMPRESSION MICRODISCECTOMY LUMBAR 4- LUMBAR 5    Allergies  Allergen Reactions  . Aleve [Naproxen Sodium] Other (See Comments)    Makes his skin  feel like it's crawling    Prior to Admission medications   Medication Sig Start Date End Date Taking? Authorizing Provider  clonazePAM (KLONOPIN) 1 MG tablet Take 1 mg by mouth at bedtime as needed for anxiety.   Yes [provider]  cyclobenzaprine (FLEXERIL) 10 MG tablet Take 1 tablet (10 mg total) by mouth 3 (three) times daily as needed for muscle spasms. 05/21/17  Yes Newman Pies, MD  docusate sodium (COLACE) 100 MG capsule Take 1 capsule (100 mg total) by mouth 2 (two) times daily. Patient taking differently: Take 100 mg by mouth 2 (two) times daily as needed for mild constipation.  05/21/17  Yes Newman Pies, MD  escitalopram (LEXAPRO) 10 MG tablet Take 10 mg by mouth daily. 05/06/17  Yes [provider]  fexofenadine (ALLEGRA) 180 MG tablet Take 180 mg by mouth daily.   Yes [provider]  fluticasone (FLONASE) 50 MCG/ACT nasal spray Place 1 spray into both nostrils daily as needed for allergies or rhinitis.   Yes [provider]  loratadine (CLARITIN) 10 MG tablet Take 10 mg by mouth daily as needed for allergies.    Yes [provider]  oxyCODONE (ROXICODONE) 15 MG immediate release tablet Take 1 tablet (15 mg total) by mouth every 4 (four) hours as needed for moderate pain. 05/21/17  Yes Newman Pies, MD    Social History   Social History  . Marital status: Married    Spouse name: N/A  . Number of children: N/A  . Years of education: N/A   Occupational History  .  Not on file.   Social History Main Topics  . Smoking status: Never Smoker  . Smokeless tobacco: Never Used  . Alcohol use Yes     Comment: socially  . Drug use: No  . Sexual activity: Yes   Other Topics Concern  . Not on file   Social History Narrative  . No narrative on file     Family History  Problem Relation Age of Onset  . Hypertension Mother   . Pulmonary embolism Father     ROS:  Cardiovascular: []  chest pain/pressure []   palpitations []  SOB lying flat [x]  DOE [x]  pain in legs while walking []  pain in legs at rest []  pain in legs at night []  non-healing ulcers []  hx of DVT [x]  swelling in legs  Pulmonary: []  productive cough []  asthma/wheezing []  home O2  Neurologic: []  weakness in []  arms []  legs []  numbness in []  arms []  legs []  hx of CVA []  mini stroke [] difficulty speaking or slurred speech []  temporary loss of vision in one eye []  dizziness  Hematologic: []  hx of cancer []  bleeding problems []  problems with blood clotting easily  Endocrine:   []  diabetes []  thyroid disease  GI []  vomiting blood []  blood in stool  GU: []  CKD/renal failure []  HD--[]  M/W/F or []  T/T/S []  burning with urination []  blood in urine  Psychiatric: [x]  anxiety []  depression  Musculoskeletal: []  arthritis []  joint pain  Integumentary: []  rashes []  ulcers  Constitutional: []  fever []  chills   Physical Examination  Vitals:   05/30/17 0444 05/30/17 1119  BP: 112/78 (!) 142/87  Pulse: 87   Resp: 20 18  Temp: 98.1 F (36.7 C) 98 F (36.7 C)   Body mass index is 33.45 kg/m.  General:  WDWN in NAD Gait: Not observed HENT: WNL, normocephalic Pulmonary: normal non-labored breathing, without Rales, rhonchi,  wheezing Cardiac: rrr Abdomen:  soft, NT/ND, no masses Extremities: minimal non-pitting edema of left leg Musculoskeletal: no muscle wasting or atrophy  Neurologic: A&O X 3; SENSATION: normal; MOTOR FUNCTION:  moving all extremities equally. Speech is fluent/normal Psychiatric:  Appropriate mood and affect  CBC    Component Value Date/Time   WBC 6.1 05/30/2017 0204   RBC 3.83 (L) 05/30/2017 0204   HGB 12.8 (L) 05/30/2017 0204   HCT 36.2 (L) 05/30/2017 0204   PLT 194 05/30/2017 0204   MCV 94.5 05/30/2017 0204   MCH 33.4 05/30/2017 0204   MCHC 35.4 05/30/2017 0204   RDW 12.0 05/30/2017 0204   LYMPHSABS 2.7 05/28/2017 2300   MONOABS 0.9 05/28/2017 2300   EOSABS 0.1  05/28/2017 2300   BASOSABS 0.0 05/28/2017 2300    BMET    Component Value Date/Time   NA 131 (L) 05/29/2017 0324   K 3.5 05/29/2017 0324   CL 96 (L) 05/29/2017 0324   CO2 27 05/29/2017 0324   GLUCOSE 116 (H) 05/29/2017 0324   BUN 7 05/29/2017 0324   CREATININE 1.00 05/29/2017 0324   CALCIUM 8.4 (L) 05/29/2017 0324   GFRNONAA >60 05/29/2017 0324   GFRAA >60 05/29/2017 0324    COAGS: Lab Results  Component Value Date   INR 1.19 05/28/2017     Non-Invasive Vascular Imaging:   Summary:  - No evidence of deep vein thrombosis involving the right lower   extremity. - Findings consistent with acute deep vein thrombosis involving the   gastrocnemius vein of the left lower extremity. - Incidental findings are consistent with: Baker&'s Cyst on the  left.  ASSESSMENT/PLAN: This is a 41 y.o. male with dvt and pe that is provoked from recent back surgery. He does have a family history but that also appears to have been provoked. We discussed thrombophilic workup which at this time would likely be incorrect given recent clotting episode and unnecessary given he needs anticoagulation anyway. He should get 3 months of xarelto and baby aspirin lifelong thereafter. We also discussed avoiding prolonged periods of immobility and possible need for bridging therapy with future surgery. Defer thrombophilic workup decision to pcp after 3 months.   Avalee Castrellon C. Donzetta Matters, MD [facility] Office: [phone] Pager: [phone]

## 2017-05-30 NOTE — Progress Notes (Signed)
ANTICOAGULATION CONSULT NOTE - Follow-up Consult  Pharmacy Consult for Heparin > rivaroxaban  Indication: pulmonary embolus  Allergies  Allergen Reactions  . Aleve [Naproxen Sodium] Other (See Comments)    Makes his skin feel like it's crawling    Patient Measurements: Height: 6\' 5"  (195.6 cm) Weight: 282 lb 1.6 oz (128 kg) IBW/kg (Calculated) : 89.1 Heparin Dosing Weight: 120 kg  Vital Signs: Temp: 98.1 F (36.7 C) (06/14 0444) Temp Source: Oral (06/14 0444) BP: 112/78 (06/14 0444) Pulse Rate: 87 (06/14 0444)  Labs:  Recent Labs  05/28/17 2300 05/28/17 2306 05/29/17 0324 05/29/17 0719 05/29/17 1329 05/30/17 0204  HGB 14.7  --  13.6  --   --  12.8*  HCT 41.2  --  38.3*  --   --  36.2*  PLT 188  --  193  --   --  194  APTT  --  40*  --   --   --   --   LABPROT  --  15.1  --   --   --   --   INR  --  1.19  --   --   --   --   HEPARINUNFRC  --   --   --  0.42 0.34 0.27*  CREATININE 1.06  --  1.00  --   --   --   TROPONINI  --   --  <0.03  --   --   --     Estimated Creatinine Clearance: 144 mL/min (by C-G formula based on SCr of 1 mg/dL).  Assessment: 41 y.o. male with new PE on IV heparin. Heparin level down to subtherapeutic (0.27) on gtt at 2050 units/hr, rate increased 2300 uts/hr and heparin level planned for this am.   Plan to change to oral AC> rivaroxaban 15mg  BID x21 days then 20mg  daily x3 months.  Will begin with lunch at noon today and turn off heparin at same time.  Goal of Therapy:  Heparin level 0.3-0.7 units/ml Monitor platelets by anticoagulation protocol: Yes   Plan:  Heparin 2300 uts/hr until noon At noon turn off heparin and start rivaroxaban 15mg  BID x21 days then 20mg  daily Education complete  Bonnita Nasuti Pharm.D. CPP, BCPS Clinical Pharmacist [phone] 05/30/2017 9:52 AM

## 2017-05-30 NOTE — Discharge Instructions (Addendum)
Information on my medicine - XARELTO (rivaroxaban)  This medication education was reviewed with me or my healthcare representative as part of my discharge preparation.   WHY WAS XARELTO PRESCRIBED FOR YOU? Xarelto was prescribed to treat blood clots that may have been found in the veins of your legs (deep vein thrombosis) or in your lungs (pulmonary embolism) and to reduce the risk of them occurring again.  What do you need to know about Xarelto? The starting dose is one 15 mg tablet taken TWICE daily with food for the FIRST 21 DAYS then on Thursday July 5th the dose is changed to one 20 mg tablet taken ONCE A DAY with food.  DO NOT stop taking Xarelto without talking to the health care provider who prescribed the medication.  Refill your prescription for 20 mg tablets before you run out.  After discharge, you should have regular check-up appointments with your healthcare provider that is prescribing your Xarelto.  In the future your dose may need to be changed if your kidney function changes by a significant amount.  What do you do if you miss a dose? If you are taking Xarelto TWICE DAILY and you miss a dose, take it as soon as you remember. You may take two 15 mg tablets (total 30 mg) at the same time then resume your regularly scheduled 15 mg twice daily the next day.  If you are taking Xarelto ONCE DAILY and you miss a dose, take it as soon as you remember on the same day then continue your regularly scheduled once daily regimen the next day. Do not take two doses of Xarelto at the same time.   Important Safety Information Xarelto is a blood thinner medicine that can cause bleeding. You should call your healthcare provider right away if you experience any of the following: ? Bleeding from an injury or your nose that does not stop. ? Unusual colored urine (red or dark brown) or unusual colored stools (red or black). ? Unusual bruising for unknown reasons. ? A serious fall or if  you hit your head (even if there is no bleeding).  Some medicines may interact with Xarelto and might increase your risk of bleeding while on Xarelto. To help avoid this, consult your healthcare provider or pharmacist prior to using any new prescription or non-prescription medications, including herbals, vitamins, non-steroidal anti-inflammatory drugs (NSAIDs) and supplements.  This website has more information on Xarelto: [email or web address]

## 2017-05-30 NOTE — Progress Notes (Signed)
Call placed to [facility] to notify of telemetry monitoring d/c.   IV d/c without complications

## 2017-05-30 NOTE — Progress Notes (Signed)
ANTICOAGULATION CONSULT NOTE - Follow-up Consult  Pharmacy Consult for Heparin Indication: pulmonary embolus  Allergies  Allergen Reactions  . Aleve [Naproxen Sodium] Other (See Comments)    Makes his skin feel like it's crawling    Patient Measurements: Height: 6\' 5"  (195.6 cm) Weight: 284 lb 3.2 oz (128.9 kg) IBW/kg (Calculated) : 89.1 Heparin Dosing Weight: 120 kg  Vital Signs: Temp: 98.2 F (36.8 C) (06/14 0039) Temp Source: Oral (06/14 0039) BP: 126/77 (06/14 0039) Pulse Rate: 97 (06/14 0039)  Labs:  Recent Labs  05/28/17 2300 05/28/17 2306 05/29/17 0324 05/29/17 0719 05/29/17 1329 05/30/17 0204  HGB 14.7  --  13.6  --   --  12.8*  HCT 41.2  --  38.3*  --   --  36.2*  PLT 188  --  193  --   --  194  APTT  --  40*  --   --   --   --   LABPROT  --  15.1  --   --   --   --   INR  --  1.19  --   --   --   --   HEPARINUNFRC  --   --   --  0.42 0.34 0.27*  CREATININE 1.06  --  1.00  --   --   --   TROPONINI  --   --  <0.03  --   --   --     Estimated Creatinine Clearance: 144.4 mL/min (by C-G formula based on SCr of 1 mg/dL).  Assessment: 41 y.o. male with new PE on IV heparin. Heparin level down to subtherapeutic (0.27) on gtt at 2050 units/hr.  Goal of Therapy:  Heparin level 0.3-0.7 units/ml Monitor platelets by anticoagulation protocol: Yes   Plan:  Increase heparin infusion to 2300 units/hr Will f/u 6hr heparin level  Sherlon Handing, PharmD, BCPS Clinical pharmacist, pager [phone] 05/30/2017 3:42 AM

## 2017-05-30 NOTE — Discharge Summary (Signed)
Physician Discharge Summary  Luis Roberts TMH:[id number] DOB: 1976/02/02 DOA: 05/28/2017  PCP: Carlus Pavlov, MD  Admit date: 05/28/2017 Discharge date: 05/30/2017   Recommendations for Outpatient Follow-Up:   Per vascular: He should get 3 months of xarelto and baby aspirin lifelong thereafter. We also discussed avoiding prolonged periods of immobility and possible need for bridging therapy with future surgery. Defer thrombophilic workup decision to pcp after 3 months.  CMP outpatient to follow up on below abnormal labs (Na, bilirubin)   Discharge Diagnosis:   Principal Problem:   Pulmonary embolism ([place]) Active Problems:   Pulmonary embolus [facility])   Discharge disposition:  Home.  Discharge Condition: Improved.  Diet recommendation:  Regular.  Wound care: None.   History of Present Illness:   Luis Roberts is a 41 y.o. male with history of anxiety who has had recent low back surgery and discharge 1 week ago presents to the ER with complaints of shortness of breath. Patient states that after discharge patient noticed increasing swelling in the lower extremity more on the left side. Last couple of days patient has been having exertional shortness of breath. Denies any fever or chills or productive cough.   ED Course: On exam patient has left leg swelling. CT angiogram of the chest shows bilateral lower lobe pulmonary embolism. Borderline strain pattern. Patient was easily getting hypoxic on walking so has been admitted for further observation. Patient was started on heparin.   Hospital Course by Problem:   LLE DVT and b/l PE- acute xarelto starter pack -per vascular (patient request): He should get 3 months of xarelto and baby aspirin lifelong thereafter. We also discussed avoiding prolonged periods of immobility and possible need for bridging therapy with future surgery. Defer thrombophilic workup decision to pcp after 3 months.  Echo: - Left ventricle: The cavity size was  normal. Systolic function was   normal. The estimated ejection fraction was in the range of 60%   to 65%. Wall motion was normal; there were no regional wall   motion abnormalities. Left ventricular diastolic function   parameters were normal. - Aortic valve: Transvalvular velocity was within the normal range.   There was no stenosis. There was no regurgitation. - Mitral valve: Transvalvular velocity was within the normal range.   There was no evidence for stenosis. There was no regurgitation. - Right ventricle: The cavity size was normal. Wall thickness was   normal. Systolic function was normal. CT Scan suggestive of PNA-- patient with no symptoms so will not treat  Medical Consultants:   vascular   Discharge Exam:   Vitals:   05/30/17 0444 05/30/17 1119  BP: 112/78 (!) 142/87  Pulse: 87   Resp: 20 18  Temp: 98.1 F (36.7 C) 98 F (36.7 C)   Vitals:   05/29/17 2024 05/30/17 0039 05/30/17 0444 05/30/17 1119  BP: 125/83 126/77 112/78 (!) 142/87  Pulse: (!) 101 97 87   Resp: 19 20 20 18   Temp:  98.2 F (36.8 C) 98.1 F (36.7 C) 98 F (36.7 C)  TempSrc:  Oral Oral Oral  SpO2: 99% 96% 98% 95%  Weight:   128 kg (282 lb 1.6 oz)   Height:        Gen:  NAD    The results of significant diagnostics from this hospitalization (including imaging, microbiology, ancillary and laboratory) are listed below for reference.     Procedures and Diagnostic Studies:   Dg Chest 2 View  Result Date: 05/29/2017 CLINICAL DATA:  Acute onset of shortness of breath. Initial encounter. EXAM: CHEST  2 VIEW COMPARISON:  CTA of the chest performed 05/28/2017 FINDINGS: The lungs are well-aerated and clear. There is no evidence of focal opacification, pleural effusion or pneumothorax. The heart is normal in size; the mediastinal contour is within normal limits. No acute osseous abnormalities are seen. IMPRESSION: No acute cardiopulmonary process seen. Electronically Signed   By: Garald Balding  M.D.   On: 05/29/2017 01:38   Ct Angio Chest Pe W Or Wo Contrast  Result Date: 05/29/2017 CLINICAL DATA:  Acute onset of shortness of breath and generalized chest pain. Left leg swelling. Initial encounter. EXAM: CT ANGIOGRAPHY CHEST WITH CONTRAST TECHNIQUE: Multidetector CT imaging of the chest was performed using the standard protocol during bolus administration of intravenous contrast. Multiplanar CT image reconstructions and MIPs were obtained to evaluate the vascular anatomy. CONTRAST:  80 mL of Isovue 370 IV contrast COMPARISON:  None. FINDINGS: Cardiovascular: There is pulmonary embolus within pulmonary arteries to the lower lobes bilaterally. Evaluation for pulmonary embolus is somewhat suboptimal due to limitations in the timing of the contrast bolus. The RV/LV ratio is just below 0.9, borderline normal. The heart remains normal in size. The thoracic aorta is grossly unremarkable. The great vessels are within normal limits. No calcific atherosclerotic disease is seen. Mediastinum/Nodes: No mediastinal lymphadenopathy is seen. No pericardial effusion is identified. The visualized portions of the thyroid gland are unremarkable. No axillary lymphadenopathy is appreciated. Lungs/Pleura: Mild patchy airspace opacities at the central aspect of the right lung raise concern for infection. No pleural effusion or pneumothorax is seen. No masses are identified. Upper Abdomen: The visualized portions of the liver and spleen are unremarkable. Musculoskeletal: No acute osseous abnormalities are identified. The visualized musculature is unremarkable in appearance. Review of the MIP images confirms the above findings. IMPRESSION: 1. Pulmonary embolus within pulmonary arteries to the lower lobes bilaterally. RV/LV ratio is borderline normal, just below 0.9. 2. Mild patchy airspace opacities at the central aspect of the right lung raise concern for pneumonia. Critical Value/emergent results were called by telephone at  the time of interpretation on 05/29/2017 at 12:20 am to Dr. Arlean Hopping, who verbally acknowledged these results. Electronically Signed   By: Garald Balding M.D.   On: 05/29/2017 00:21     Labs:   Basic Metabolic Panel:  Recent Labs Lab 05/28/17 2300 05/29/17 0324  NA 131* 131*  K 3.7 3.5  CL 98* 96*  CO2 26 27  GLUCOSE 120* 116*  BUN 10 7  CREATININE 1.06 1.00  CALCIUM 8.8* 8.4*   GFR Estimated Creatinine Clearance: 144 mL/min (by C-G formula based on SCr of 1 mg/dL). Liver Function Tests:  Recent Labs Lab 05/28/17 2306  AST 37  ALT 35  ALKPHOS 51  BILITOT 1.5*  PROT 6.7  ALBUMIN 3.5   No results for input(s): LIPASE, AMYLASE in the last 168 hours. No results for input(s): AMMONIA in the last 168 hours. Coagulation profile  Recent Labs Lab 05/28/17 2306  INR 1.19    CBC:  Recent Labs Lab 05/28/17 2300 05/29/17 0324 05/30/17 0204  WBC 9.0 8.1 6.1  NEUTROABS 5.4  --   --   HGB 14.7 13.6 12.8*  HCT 41.2 38.3* 36.2*  MCV 93.6 94.1 94.5  PLT 188 193 194   Cardiac Enzymes:  Recent Labs Lab 05/29/17 0324  TROPONINI <0.03   BNP: Invalid input(s): POCBNP CBG: No results for input(s): GLUCAP in the last 168 hours. D-Dimer  Recent Labs  05/28/17 2300  DDIMER 3.89*   Hgb A1c No results for input(s): HGBA1C in the last 72 hours. Lipid Profile No results for input(s): CHOL, HDL, LDLCALC, TRIG, CHOLHDL, LDLDIRECT in the last 72 hours. Thyroid function studies No results for input(s): TSH, T4TOTAL, T3FREE, THYROIDAB in the last 72 hours.  Invalid input(s): FREET3 Anemia work up No results for input(s): VITAMINB12, FOLATE, FERRITIN, TIBC, IRON, RETICCTPCT in the last 72 hours. Microbiology Recent Results (from the past 240 hour(s))  Surgical pcr screen     Status: None   Collection Time: 05/20/17  1:45 PM  Result Value Ref Range Status   MRSA, PCR NEGATIVE NEGATIVE Final   Staphylococcus aureus NEGATIVE NEGATIVE Final    Comment:        The  Xpert SA Assay (FDA approved for NASAL specimens in patients over 38 years of age), is one component of a comprehensive surveillance program.  Test performance has been validated by [facility] for patients greater than or equal to 67 year old. It is not intended to diagnose infection nor to guide or monitor treatment.      Discharge Instructions:   Discharge Instructions    Diet general    Complete by:  As directed    Increase activity slowly    Complete by:  As directed      Allergies as of 05/30/2017      Reactions   Aleve [naproxen Sodium] Other (See Comments)   Makes his skin feel like it's crawling      Medication List    TAKE these medications   clonazePAM 1 MG tablet Commonly known as:  KLONOPIN Take 1 mg by mouth at bedtime as needed for anxiety.   cyclobenzaprine 10 MG tablet Commonly known as:  FLEXERIL Take 1 tablet (10 mg total) by mouth 3 (three) times daily as needed for muscle spasms.   docusate sodium 100 MG capsule Commonly known as:  COLACE Take 1 capsule (100 mg total) by mouth 2 (two) times daily. What changed:  when to take this  reasons to take this   escitalopram 10 MG tablet Commonly known as:  LEXAPRO Take 10 mg by mouth daily.   fexofenadine 180 MG tablet Commonly known as:  ALLEGRA Take 180 mg by mouth daily.   fluticasone 50 MCG/ACT nasal spray Commonly known as:  FLONASE Place 1 spray into both nostrils daily as needed for allergies or rhinitis.   loratadine 10 MG tablet Commonly known as:  CLARITIN Take 10 mg by mouth daily as needed for allergies.   oxyCODONE 15 MG immediate release tablet Commonly known as:  ROXICODONE Take 1 tablet (15 mg total) by mouth every 4 (four) hours as needed for moderate pain.   Rivaroxaban 15 & 20 MG Tbpk Take as directed on package: Start with one 15mg  tablet by mouth twice a day with food. On Day 22, switch to one 20mg  tablet once a day with food.      Follow-up Information     Carlus Pavlov, MD Follow up in 1 week(s).   Specialty:  Family Medicine Contact information: [address]. [place] [address] [phone]            Time coordinating discharge: 35 min  Signed:  Cashus Halterman U Nesreen Albano   [facility] 05/30/2017, 12:09 PM

## 2017-05-30 NOTE — Progress Notes (Signed)
Patient refused bed alarm. Will continue to monitor patient. Wife @ bedside.

## 2017-10-24 ENCOUNTER — Other Ambulatory Visit (HOSPITAL_COMMUNITY): Payer: Self-pay | Admitting: Family Medicine

## 2017-10-24 DIAGNOSIS — Z86711 Personal history of pulmonary embolism: Secondary | ICD-10-CM

## 2017-10-24 DIAGNOSIS — I824Y2 Acute embolism and thrombosis of unspecified deep veins of left proximal lower extremity: Secondary | ICD-10-CM

## 2017-10-30 ENCOUNTER — Ambulatory Visit (HOSPITAL_COMMUNITY)
Admission: RE | Admit: 2017-10-30 | Discharge: 2017-10-30 | Disposition: A | Discharge status: Home / Self Care | Payer: 59 | Source: Ambulatory Visit | Attending: Family Medicine | Admitting: Family Medicine

## 2017-10-30 ENCOUNTER — Ambulatory Visit (HOSPITAL_BASED_OUTPATIENT_CLINIC_OR_DEPARTMENT_OTHER)
Admission: RE | Admit: 2017-10-30 | Discharge: 2017-10-30 | Disposition: A | Discharge status: Home / Self Care | Payer: 59 | Source: Ambulatory Visit | Attending: Cardiology | Admitting: Cardiology

## 2017-10-30 DIAGNOSIS — I824Z2 Acute embolism and thrombosis of unspecified deep veins of left distal lower extremity: Secondary | ICD-10-CM | POA: Diagnosis not present

## 2017-10-30 DIAGNOSIS — Z86711 Personal history of pulmonary embolism: Secondary | ICD-10-CM | POA: Diagnosis not present

## 2017-10-30 DIAGNOSIS — I824Y2 Acute embolism and thrombosis of unspecified deep veins of left proximal lower extremity: Secondary | ICD-10-CM | POA: Diagnosis not present

## 2017-10-30 DIAGNOSIS — I82442 Acute embolism and thrombosis of left tibial vein: Secondary | ICD-10-CM | POA: Diagnosis present

## 2017-10-30 MED ORDER — IOPAMIDOL (ISOVUE-370) INJECTION 76%
INTRAVENOUS | Status: AC
  Administered 2017-10-30: 100 mL
  Filled 2017-10-30: qty 100

## 2017-10-30 NOTE — Progress Notes (Signed)
Preliminary results by tech - Left Lower Ext. Venous duplex Completed.  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 All other veins are patent without evidence of thrombus. Oda Cogan, BS, RDMS, RVT

## 2018-01-08 ENCOUNTER — Other Ambulatory Visit (HOSPITAL_COMMUNITY): Payer: Self-pay | Admitting: Family Medicine

## 2018-01-08 DIAGNOSIS — I2699 Other pulmonary embolism without acute cor pulmonale: Secondary | ICD-10-CM

## 2018-01-09 ENCOUNTER — Encounter (HOSPITAL_COMMUNITY): Payer: Self-pay

## 2018-01-09 ENCOUNTER — Ambulatory Visit (HOSPITAL_COMMUNITY)
Admission: RE | Admit: 2018-01-09 | Discharge: 2018-01-09 | Disposition: A | Discharge status: Home / Self Care | Payer: 59 | Source: Ambulatory Visit | Attending: Family Medicine | Admitting: Family Medicine

## 2018-01-09 DIAGNOSIS — I2699 Other pulmonary embolism without acute cor pulmonale: Secondary | ICD-10-CM

## 2018-01-09 DIAGNOSIS — Z86711 Personal history of pulmonary embolism: Secondary | ICD-10-CM | POA: Diagnosis present

## 2018-01-09 DIAGNOSIS — I77810 Thoracic aortic ectasia: Secondary | ICD-10-CM | POA: Diagnosis not present

## 2018-01-09 DIAGNOSIS — K760 Fatty (change of) liver, not elsewhere classified: Secondary | ICD-10-CM | POA: Diagnosis not present

## 2018-01-09 MED ORDER — IOPAMIDOL (ISOVUE-370) INJECTION 76%
100.0000 mL | Freq: Once | INTRAVENOUS | Status: AC | PRN
  Administered 2018-01-09: 100 mL via INTRAVENOUS

## 2018-01-09 MED ORDER — IOPAMIDOL (ISOVUE-370) INJECTION 76%
INTRAVENOUS | Status: AC
  Administered 2018-01-09: 100 mL via INTRAVENOUS
  Filled 2018-01-09: qty 100

## 2018-01-24 ENCOUNTER — Encounter: Payer: Self-pay | Admitting: Internal Medicine

## 2018-01-24 ENCOUNTER — Ambulatory Visit (INDEPENDENT_AMBULATORY_CARE_PROVIDER_SITE_OTHER): Payer: 59 | Admitting: Internal Medicine

## 2018-01-24 VITALS — BP 130/88 | HR 80 | Ht 77.0 in | Wt 310.0 lb

## 2018-01-24 DIAGNOSIS — I2699 Other pulmonary embolism without acute cor pulmonale: Secondary | ICD-10-CM

## 2018-01-24 DIAGNOSIS — I77810 Thoracic aortic ectasia: Secondary | ICD-10-CM | POA: Diagnosis not present

## 2018-01-24 DIAGNOSIS — R0609 Other forms of dyspnea: Secondary | ICD-10-CM | POA: Diagnosis not present

## 2018-01-24 MED ORDER — PANTOPRAZOLE SODIUM 40 MG PO TBEC
40.0000 mg | DELAYED_RELEASE_TABLET | Freq: Every day | ORAL | 2 refills | Status: DC
Start: 2018-01-24 — End: 2018-04-25

## 2018-01-24 MED ORDER — FAMOTIDINE 20 MG PO TABS
ORAL_TABLET | ORAL | 2 refills | Status: DC
Start: 2018-01-24 — End: 2018-04-25

## 2018-01-24 NOTE — Patient Instructions (Addendum)
Weight control is simply a matter of calorie balance which needs to be tilted in your favor by eating less and exercising more.  To get the most out of exercise, you need to be continuously aware that you are short of breath, but never out of breath, for 30 minutes daily. As you improve, it will actually be easier for you to do the same amount of exercise  in  30 minutes so always push to the level where you are short of breath.  If this does not result in gradual weight reduction then I strongly recommend you see a nutritionist with a food diary x 2 weeks so that we can work out a negative calorie balance which is universally effective in steady weight loss programs.  Think of your calorie balance like you do your bank account where in this case you want the balance to go down so you must take in less calories than you burn up.  It's just that simple:  Hard to do, but easy to understand.  Good luck!    Pantoprazole (protonix) 40 mg   Take  30-60 min before first meal of the day and Pepcid (famotidine)  20 mg one @  bedtime until return to office - this is the best way to tell whether stomach acid is contributing to your problem.    GERD (REFLUX)  is an extremely common cause of respiratory symptoms just like yours , many times with no obvious heartburn at all.    It can be treated with medication, but also with lifestyle changes including elevation of the head of your bed (ideally with 6 inch  bed blocks),  Smoking cessation, avoidance of late meals, excessive alcohol, and avoid fatty foods, chocolate, peppermint, colas, red wine, and acidic juices such as orange juice.  NO MINT OR MENTHOL PRODUCTS SO NO COUGH DROPS   USE SUGARLESS CANDY INSTEAD (Jolley ranchers or Stover's or Life Savers) or even ice chips will also do - the key is to swallow to prevent all throat clearing. NO OIL BASED VITAMINS - use powdered substitutes.  If cough or breathing remain difficult to sort out the next study you need is  cpst on a bicycle ergometer     Please see patient coordinator before you leave today  to schedule echocardiogram and decide on next step

## 2018-01-24 NOTE — Progress Notes (Signed)
Subjective:     Patient ID: Luis Roberts, male   DOB: 11/22/1976,   MRN: [id number]  HPI  43 yowm never smoker  With year round rhinitis "all his life" typically only  better in winter controlled with allegra but very vigorous aerobically at wt around 245  then with back pain > L4/5 with drop L foot > laminectomy 05/22/17  And 05/28/17 with sob s cp > dx pe rx xarelto but never got over the sob = both  at rest sporadically  and with activity eg recumbent bike x 30 min some resistance at low and can't get back from mb to house flat 275 without sob/ needs to rest > referred to pulmonary clinic 01/24/2018 by Dr   Jeryl Columbia for sob   01/24/2018 1st [name] office visit/ [name]   Chief Complaint  Patient presents with  . Pulmonary Consult    Referred by Dr. Jeryl Columbia. Pt c/o SOB since he had PE and DVT in June 2018. He gets SOB and very tired with minimal exertion. He also gets light headed and dizzy occ.   cough onset oct 2018 assoc with sense of pnds day > noct does not wake him up, non productive and assoc with sense of sob at rest at times some better wit flonase/ allergra When could jog 3 miles early 2018 wt was 245 vs 310 now with doe as above   No obvious day to day or daytime variability or assoc excess/ purulent sputum or mucus plugs or hemoptysis or cp or chest tightness, subjective wheeze or overt sinus or hb symptoms. No unusual exposure hx or h/o childhood pna/ asthma or knowledge of premature birth.  Sleeping ok flat without nocturnal  or early am exacerbation  of respiratory  c/o's or need for noct saba. Also denies any obvious fluctuation of symptoms with weather or environmental changes or other aggravating or alleviating factors except as outlined above   Current Allergies, Complete Past Medical History, Past Surgical History, Family History, and Social History were reviewed in [employer] record.  ROS  The following are not active complaints unless  bolded Hoarseness, sore throat, dysphagia, dental problems, itching, sneezing,  nasal congestion or discharge of excess mucus or purulent secretions, ear ache,   fever, chills, sweats, unintended wt loss or wt gain, classically pleuritic or exertional cp,  orthopnea pnd or leg swelling, presyncope, palpitations, abdominal pain, anorexia, nausea, vomiting, diarrhea  or change in bowel habits or change in bladder habits, change in stools or change in urine, dysuria, hematuria,  rash, arthralgias, visual complaints, headache, numbness, weakness or ataxia or problems with walking or coordination,  change in mood/affect or memory.        Current Meds  Medication Sig  . clonazePAM (KLONOPIN) 1 MG tablet Take 1 mg by mouth at bedtime as needed for anxiety.  . cyclobenzaprine (FLEXERIL) 10 MG tablet Take 1 tablet (10 mg total) by mouth 3 (three) times daily as needed for muscle spasms.  Marland Kitchen escitalopram (LEXAPRO) 10 MG tablet Take 10 mg by mouth daily.  . fexofenadine (ALLEGRA) 180 MG tablet Take 180 mg by mouth daily.  . fluticasone (FLONASE) 50 MCG/ACT nasal spray Place 1 spray into both nostrils daily as needed for allergies or rhinitis.  Marland Kitchen gabapentin (NEURONTIN) 600 MG tablet Take 1 tablet by mouth 3 (three) times daily.  Marland Kitchen loratadine (CLARITIN) 10 MG tablet Take 10 mg by mouth daily as needed for allergies.   Marland Kitchen oxyCODONE (ROXICODONE) 15  MG immediate release tablet Take 1 tablet (15 mg total) by mouth every 4 (four) hours as needed for moderate pain.  Marland Kitchen PROAIR HFA 108 (90 Base) MCG/ACT inhaler Inhale 2 puffs into the lungs every 4 (four) hours as needed.  . Rivaroxaban (XARELTO) 15 MG TABS tablet Take 15 mg by mouth 2 (two) times daily with a meal.         Review of Systems     Objective:   Physical Exam    amb wm nad mod voice fatige    Wt Readings from Last 3 Encounters:  01/24/18 (!) 310 lb (140.6 kg)  05/30/17 282 lb 1.6 oz (128 kg)  05/20/17 292 lb (132.5 kg)     Vital signs  reviewed - Note on arrival 02 sats  98% and on RA   HEENT: nl dentition, turbinates bilaterally, and oropharynx. Nl external ear canals without cough reflex   NECK :  without JVD/Nodes/TM/ nl carotid upstrokes bilaterally   LUNGS: no acc muscle use,  Nl contour chest which is clear to A and P bilaterally without cough on insp or exp maneuvers   CV:  RRR  no s3 or murmur or increase in P2, and no edema / wearing tight elastic support hose   ABD: obese/ soft and nontender with nl inspiratory excursion in the supine position. No bruits or organomegaly appreciated, bowel sounds nl  MS:  Walks with limp/ ext warm without deformities, calf tenderness, cyanosis or clubbing No obvious joint restrictions   SKIN: warm and dry without lesions    NEURO:  alert, approp, nl sensorium  - unable to fully dorsiflex L foot       I personally reviewed images and agree with radiology impression as follows:   Chest CTa  01/09/18 1. No evidence of pulmonary embolism. 2. No acute findings are noted in the thorax to account for the patient's symptoms. 3. 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      Assessment:

## 2018-01-25 ENCOUNTER — Encounter: Payer: Self-pay | Admitting: Internal Medicine

## 2018-01-25 DIAGNOSIS — I77810 Thoracic aortic ectasia: Secondary | ICD-10-CM | POA: Insufficient documentation

## 2018-01-25 NOTE — Assessment & Plan Note (Addendum)
Dx 05/29/17 with Pos L DVT - Venous Dopplers 10/30/17  Right: No evidence of common femoral vein obstruction. 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   Total time devoted to counseling  > 50 % of initial 60 min office visit:  review case with pt/ discussion of options/alternatives/ personally creating written customized instructions  in presence of pt  then going over those specific  Instructions directly with the pt including how to use all of the meds but in particular covering each new medication in detail and the difference between the maintenance= "automatic" meds and the prns using an action plan format for the latter (If this problem/symptom => do that organization reading Left to right).  Please see AVS from this visit for a full list of these instructions which I personally wrote for this pt and  are unique to this visit.

## 2018-01-25 NOTE — Assessment & Plan Note (Addendum)
Complicated by PE 0/3500 pos op  Body mass index is 36.76 kg/m.  -  trending up  No results found for: TSH   Contributing to gerd risk/ doe/reviewed the need and the process to achieve and maintain neg calorie balance > defer f/u primary care including intermittently monitoring thyroid status    see avs for instructions unique to this ov

## 2018-01-25 NOTE — Assessment & Plan Note (Addendum)
See CTa  01/09/18  X 4.2 cm   Will need annual f/u advised > either by PCP, cards or T surgery but I advised him this is not a problem typically followed by pulmonary and f/u here will be prn once we sort out his active symptoms

## 2018-01-25 NOTE — Assessment & Plan Note (Addendum)
Symptoms are markedly disproportionate to objective findings and not clear this is actually much of a  lung problem but pt does appear to have difficult to sort out respiratory symptoms of unknown origin for which  DDX  = almost all start with A and  include Adherence, Ace Inhibitors, Acid Reflux, Active Sinus Disease, Alpha 1 Antitripsin deficiency, Anxiety masquerading as Airways dz,  ABPA,  Allergy(esp in young), Aspiration (esp in elderly), Adverse effects of meds,  Active smokers, A bunch of PE's (a small clot burden can't cause this syndrome unless there is already severe underlying pulm or vascular dz with poor reserve),  Anemia and Thyroid dz,  plus two Bs  = Bronchiectasis and Beta blocker use..and one C= CHF     Adherence is always the initial "prime suspect" and is a multilayered concern that requires a "trust but verify" approach in every patient - starting with knowing how to use medications, especially inhalers, correctly, keeping up with refills and understanding the fundamental difference between maintenance and prns vs those medications only taken for a very short course and then stopped and not refilled.   ? Acid (or non-acid) GERD > always difficult to exclude as up to 75% of pts in some series report no assoc GI/ Heartburn symptoms> rec max (24h)  acid suppression and diet restrictions/ reviewed and instructions given in writing.   ? Allergy/asthma > does have h/o seasonal rhinitis but nothing that sounds like asthma to me  ? Anxiety > usually at the bottom of this list of usual suspects but should be much higher on this pt's based on H and P (father died of PE) and note pt already on pyschotropics> f/u pcp planned   ? A bunch of PE's > ct a neg / could have [place] but not likely if echo nl   Anemia and Thyroid dz > Hct 42.5 on 11/28/17,   TSH Deferred to PCP, suspect it's already been done   ? chf > needs echo to r/o PH and if nl and not improved on above rx then cpst next  step

## 2018-01-28 ENCOUNTER — Ambulatory Visit (HOSPITAL_BASED_OUTPATIENT_CLINIC_OR_DEPARTMENT_OTHER)
Admission: RE | Admit: 2018-01-28 | Discharge: 2018-01-28 | Disposition: A | Discharge status: Home / Self Care | Payer: 59 | Source: Ambulatory Visit | Attending: Internal Medicine | Admitting: Internal Medicine

## 2018-01-28 DIAGNOSIS — R0609 Other forms of dyspnea: Secondary | ICD-10-CM | POA: Insufficient documentation

## 2018-01-28 DIAGNOSIS — E669 Obesity, unspecified: Secondary | ICD-10-CM | POA: Insufficient documentation

## 2018-01-28 DIAGNOSIS — Z86711 Personal history of pulmonary embolism: Secondary | ICD-10-CM | POA: Insufficient documentation

## 2018-01-28 NOTE — Progress Notes (Signed)
Echocardiogram 2D Echocardiogram has been performed.  Luis Roberts 01/28/2018, 3:57 PM

## 2018-01-29 NOTE — Progress Notes (Signed)
Spoke with pt and notified of results per Dr. Wert. Pt verbalized understanding and denied any questions. 

## 2018-02-10 ENCOUNTER — Ambulatory Visit (INDEPENDENT_AMBULATORY_CARE_PROVIDER_SITE_OTHER): Payer: 59 | Admitting: Oncology

## 2018-02-10 ENCOUNTER — Encounter: Payer: Self-pay | Admitting: Oncology

## 2018-02-10 ENCOUNTER — Other Ambulatory Visit: Payer: Self-pay

## 2018-02-10 VITALS — BP 140/87 | HR 102 | Temp 98.1°F | Ht 76.0 in | Wt 317.7 lb

## 2018-02-10 DIAGNOSIS — I77819 Aortic ectasia, unspecified site: Secondary | ICD-10-CM

## 2018-02-10 DIAGNOSIS — I2782 Chronic pulmonary embolism: Secondary | ICD-10-CM | POA: Diagnosis not present

## 2018-02-10 DIAGNOSIS — Z8269 Family history of other diseases of the musculoskeletal system and connective tissue: Secondary | ICD-10-CM

## 2018-02-10 DIAGNOSIS — Z886 Allergy status to analgesic agent status: Secondary | ICD-10-CM

## 2018-02-10 DIAGNOSIS — Z7982 Long term (current) use of aspirin: Secondary | ICD-10-CM | POA: Diagnosis not present

## 2018-02-10 DIAGNOSIS — M21372 Foot drop, left foot: Secondary | ICD-10-CM

## 2018-02-10 DIAGNOSIS — M5136 Other intervertebral disc degeneration, lumbar region: Secondary | ICD-10-CM

## 2018-02-10 DIAGNOSIS — Z7901 Long term (current) use of anticoagulants: Secondary | ICD-10-CM | POA: Diagnosis not present

## 2018-02-10 DIAGNOSIS — Z9889 Other specified postprocedural states: Secondary | ICD-10-CM

## 2018-02-10 DIAGNOSIS — Z86718 Personal history of other venous thrombosis and embolism: Secondary | ICD-10-CM

## 2018-02-10 DIAGNOSIS — Z8249 Family history of ischemic heart disease and other diseases of the circulatory system: Secondary | ICD-10-CM | POA: Diagnosis not present

## 2018-02-10 LAB — D-DIMER, QUANTITATIVE: D-Dimer, Quant: 0.38 ug/mL-FEU (ref 0.00–0.50)

## 2018-02-10 NOTE — Progress Notes (Signed)
New Patient Hematology   Luis Roberts [id number] Aug 10, 1976 42 y.o. 02/10/2018  CC: Dr. [facility]   Reason for referral: Advice on need for anticoagulation   HPI:  42 year old man in overall excellent health with no major medical or surgical illness except for premature degenerative disc disease.  He has now had 3 back surgeries.  About 2 weeks following the most recent surgery on May 28, 2017 he presented with acute onset of dyspnea and was found to have bilateral distal pulmonary emboli and an acute left lower extremity DVT in the gastrocnemius vein.  D-dimer elevated 3.89.  He was anticoagulated with Xarelto.  A follow-up CT scan done October 30, 2017 showed resolution of previous emboli.  He was instructed to stop his anticoagulation however he developed unexplained dyspnea and went to the ED for evaluation on January 09, 2018.  CT was negative for recurrent pulmonary emboli.  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  He was anxious and decided to go back on his anticoagulant.   He saw Dr. Melvyn Novas, pulmonary medicine, on February 8.  If I interpret his note correctly, he believes the patient's symptoms may relate to reflux, recent weight gain, and the normal healing process related to his PE.  He would also consider hypothyroidism as potential contribution to unexplained dyspnea.  A follow-up echocardiogram was recommended and done on February 12 which shows normal left ventricular function EF 55-60%, no wall motion abnormalities, normal valve function, no diastolic dysfunction, normal right ventricular size and function and normal pulmonary artery pressure.  Patient is very concerned since he is now 42 years old and has had a pulmonary embolus and DVT.  His father was diagnosed with a benign tumor of his lung at age 76.  Tumor was surgically resected.  2 weeks later he developed a pneumothorax and while in the hospital being  treated for this had a fatal pulmonary embolus. His mother is alive and well at age 49 with no history of blood clots and she has had back surgery x7.  He has a 13 year old sister who is healthy.  A 71-year-old daughter.  He does not smoke or use recreational drugs.  He may have 1 or 2 beers a month.  He has no signs or symptoms of a collagen vascular disorder.  Prior to his PE he worked out in [employer] every day and was even doing triathlons.  He currently denies any pleuritic chest pain, chest pressure, or palpitations.  His family physician got recent labs on November 28, 2017.  CBC normal with hemoglobin 15.5, hematocrit 42.5, MCV 93, white count 5865% neutrophils, 27 lymphocytes, platelet count 200,000.  ESR 9 mm Chemistry profile normal, anticardiolipin antibodies mild elevation of IgM at 20 (reference range 0-12), IgG undetectable, lupus anticoagulant not detected.  Activated protein C resistance ratio 2.5 normal 2.2-3.5 making it unlikely that he has a factor V Leiden gene mutation, functional protein C 111% of control (73-1 80), protein S functional 115% (63-140), anti-thrombin 84% (75-135), plasma homocystine normal 8.7 (0-15).      PMH: Past Medical History:  Diagnosis Date  . Anxiety   . Complication of anesthesia    Woke up during surgeries, last surgery  took 1.5 hours to awaken  . 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 History of kidney stones    passed  . History of seasonal allergies  Past Surgical History:  Procedure Laterality Date  . arthroscopic knee surgery    . BACK SURGERY  2007ish   lumbar laminectomy L5- S1  . COLONOSCOPY    . ESOPHAGOGASTRODUODENOSCOPY    . KNEE SURGERY Left    left knee w/ screw  . LUMBAR LAMINECTOMY/DECOMPRESSION MICRODISCECTOMY Left 04/15/2014   Procedure: LUMBAR LAMINECTOMY/DECOMPRESSION MICRODISCECTOMY 1 LEVEL;  Surgeon: [name], MD;  Location: [facility];  Service: Neurosurgery;  Laterality:  Left;  Left Lumbar Four-Five Microdiskectomy  . LUMBAR LAMINECTOMY/DECOMPRESSION MICRODISCECTOMY N/A 05/20/2017   Procedure: LUMBAR LAMINECTOMY/DECOMPRESSION MICRODISCECTOMY LUMBAR FOUR- LUMBAR FIVE;  Surgeon: Newman Pies, MD;  Location: [facility];  Service: Neurosurgery;  Laterality: N/A;  LUMBAR LAMINECTOMY/DECOMPRESSION MICRODISCECTOMY LUMBAR 4- LUMBAR 5    Allergies: Allergies  Allergen Reactions  . Aleve [Naproxen Sodium] Other (See Comments)    Makes his skin feel like it's crawling    Medications:  Current Outpatient Medications:  .  clonazePAM (KLONOPIN) 1 MG tablet, Take 1 mg by mouth at bedtime as needed for anxiety., Disp: , Rfl:  .  cyclobenzaprine (FLEXERIL) 10 MG tablet, Take 1 tablet (10 mg total) by mouth 3 (three) times daily as needed for muscle spasms., Disp: 50 tablet, Rfl: 1 .  escitalopram (LEXAPRO) 10 MG tablet, Take 10 mg by mouth daily., Disp: , Rfl: 6 .  famotidine (PEPCID) 20 MG tablet, One at bedtime, Disp: 30 tablet, Rfl: 2 .  fexofenadine (ALLEGRA) 180 MG tablet, Take 180 mg by mouth daily., Disp: , Rfl:  .  fluticasone (FLONASE) 50 MCG/ACT nasal spray, Place 1 spray into both nostrils daily as needed for allergies or rhinitis., Disp: , Rfl:  .  gabapentin (NEURONTIN) 600 MG tablet, Take 1 tablet by mouth 3 (three) times daily., Disp: , Rfl: 1 .  loratadine (CLARITIN) 10 MG tablet, Take 10 mg by mouth daily as needed for allergies. , Disp: , Rfl:  .  oxyCODONE (ROXICODONE) 15 MG immediate release tablet, Take 1 tablet (15 mg total) by mouth every 4 (four) hours as needed for moderate pain., Disp: 40 tablet, Rfl: 0 .  pantoprazole (PROTONIX) 40 MG tablet, Take 1 tablet (40 mg total) by mouth daily. Take 30-60 min before first meal of the day, Disp: 30 tablet, Rfl: 2 .  PROAIR HFA 108 (90 Base) MCG/ACT inhaler, Inhale 2 puffs into the lungs every 4 (four) hours as needed., Disp: , Rfl:  .  Rivaroxaban (XARELTO) 15 MG TABS tablet, Take 15 mg by mouth 2 (two) times  daily with a meal., Disp: , Rfl:    Social History: Married.  43-year-old daughter.  He works in [employer] for [employer]. He has never smoked. he has never used smokeless tobacco.  He drinks rare 1-2 alcohol beverages per month.  He does not use drugs.  Family History: Family History  Problem Relation Age of Onset  . Hypertension Mother   . Pulmonary embolism Father 58    Review of Systems: Left foot drop subsequent to his second back surgery. See HPI  Physical Exam: Blood pressure 140/87, pulse (!) 102, temperature 98.1 F (36.7 C), temperature source Oral, height '6\' 4"'  (1.93 m), weight (!) 317 lb 11.2 oz (144.1 kg), SpO2 98 %. Wt Readings from Last 3 Encounters:  02/10/18 (!) 317 lb 11.2 oz (144.1 kg)  01/24/18 (!) 310 lb (140.6 kg)  05/30/17 282 lb 1.6 oz (128 kg)     General appearance: Muscular Caucasian man HENNT: Pharynx no erythema, exudate, mass, or ulcer.  No thyromegaly or thyroid nodules Lymph nodes: No cervical, supraclavicular, or axillary lymphadenopathy Breasts:  Lungs: Clear to auscultation, resonant to percussion throughout Heart: Regular rhythm, no murmur, no gallop, no rub, no click, no edema Abdomen: Soft, nontender, normal bowel sounds, no mass, no organomegaly Extremities: No edema, no calf tenderness Musculoskeletal: no joint deformities GU:  Vascular: Carotid pulses 2+, no bruits,  Neurologic: Alert, oriented, PERRLA, unable to visualize optic discs, vessels normal, no hemorrhage or exudate, cranial nerves grossly normal, motor strength 5 over 5, except for a left foot drop, reflexes 1+ symmetric, upper body coordination normal, gait normal, Skin: No rash or ecchymosis    Lab Results: Lab Results  Component Value Date   WBC 6.1 05/30/2017   HGB 12.8 (L) 05/30/2017   HCT 36.2 (L) 05/30/2017   MCV 94.5 05/30/2017   PLT 194 05/30/2017     Chemistry      Component Value Date/Time   NA 131 (L) 05/29/2017 0324   K 3.5 05/29/2017 0324    CL 96 (L) 05/29/2017 0324   CO2 27 05/29/2017 0324   BUN 7 05/29/2017 0324   CREATININE 1.00 05/29/2017 0324      Component Value Date/Time   CALCIUM 8.4 (L) 05/29/2017 0324   ALKPHOS 51 05/28/2017 2306   AST 37 05/28/2017 2306   ALT 35 05/28/2017 2306   BILITOT 1.5 (H) 05/28/2017 2306     D-dimer today February 10, 2018 normal at 0.38   Radiological Studies: See HPI.  I personally reviewed the CT images with the patient.    Impression: Provoked pulmonary embolus 2 weeks following back surgery  His father died of a pulmonary embolus at a young age but also an event that was provoked by major surgery. The patient is understandably concerned that he may have inherited a clotting tendency although it is just as likely that this was just a coincidence.    Recommendation: I do not think that he needs to be committed to long-term anticoagulation.  D-dimer currently normal. No current evidence for an inherited or acquired coagulopathy at least among the disorders that we commonly see. I did tell him to go ahead and start 81 mg of aspirin which provide some protection albeit only about 30% reduction in recurrent thrombosis. I am going to repeat the antiphospholipid antibody panel.  I will add antibodies to beta-2 glycoprotein 1 which is more specific than anticardiolipin antibodies.  Repeat the lupus anticoagulant to make sure it is reproducibly negative now about 8 weeks since previous testing.  We will have to wait until he is off the Xarelto again to test for the lupus anticoagulant.    Murriel Hopper, MD, FACP  Hematology-Oncology/Internal Medicine  Addendum: D-dimer 0.38; repeat lupus anticoagulant negative; negative antibodies to beta-2-glycoprotein, minimal, irrelevant elevation of IgM only ACLA.  02/10/2018, 5:40 PM

## 2018-02-10 NOTE — Patient Instructions (Signed)
To lab today Return visit 4-6 weeks to review results

## 2018-02-12 LAB — CARDIOLIPIN ANTIBODIES, IGG, IGM, IGA
Anticardiolipin IgA: <9 APL U/mL (ref 0–11)
Anticardiolipin IgG: <9 GPL U/mL (ref 0–14)
Anticardiolipin IgM: 18 MPL U/mL — ABNORMAL HIGH (ref 0–12)

## 2018-02-12 LAB — BETA-2-GLYCOPROTEIN I ABS, IGG/M/A: Beta-2 Glyco I IgG: <9 GPI IgG units (ref 0–20)

## 2018-02-14 LAB — LUPUS ANTICOAGULANT PANEL
DRVVT: 41.2 s (ref 0.0–47.0)
PTT Lupus Anticoagulant: 41.4 s (ref 0.0–51.9)

## 2018-02-14 LAB — FACTOR 5 LEIDEN

## 2018-02-14 LAB — PROTHROMBIN GENE MUTATION

## 2018-02-17 ENCOUNTER — Other Ambulatory Visit (HOSPITAL_BASED_OUTPATIENT_CLINIC_OR_DEPARTMENT_OTHER): Payer: 59

## 2018-04-25 ENCOUNTER — Other Ambulatory Visit: Payer: Self-pay | Admitting: Internal Medicine

## 2018-04-25 DIAGNOSIS — R0609 Other forms of dyspnea: Principal | ICD-10-CM

## 2018-05-27 IMAGING — CT CT ANGIO CHEST
2 of 8 series · 17 of 46 positions shown · IV contrast (isovue)
Comparison: None.

CLINICAL DATA: Acute onset of shortness of breath and generalized
chest pain. Left leg swelling. Initial encounter.

EXAM:
CT ANGIOGRAPHY CHEST WITH CONTRAST
TECHNIQUE: Multidetector CT imaging of the chest was performed using the
standard protocol during bolus administration of intravenous
contrast. Multiplanar CT image reconstructions and MIPs were
obtained to evaluate the vascular anatomy.
CONTRAST:  80 mL of Isovue 370 IV contrast

[Series 6: thins · axial · 0.79mm/px · z∈[+1310,+1542]mm · 14 of 256 slices shown]
[im 12/256  lung]
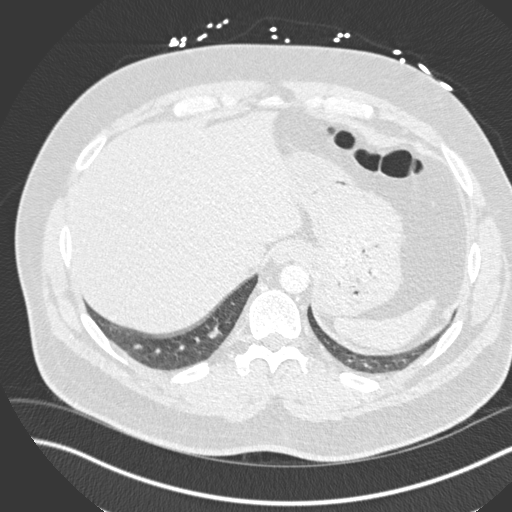
[im 35/256  soft-tissue]
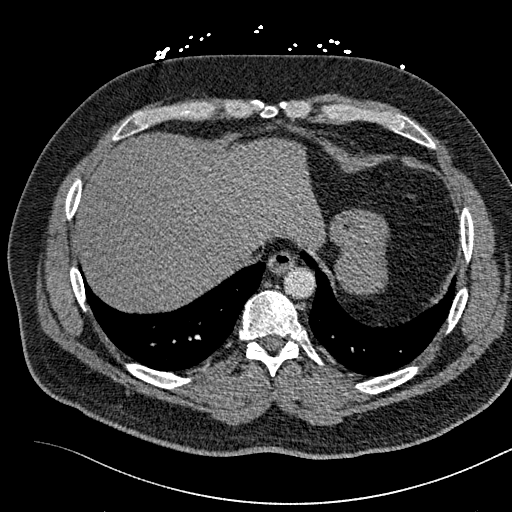
[im 47/256  lung]
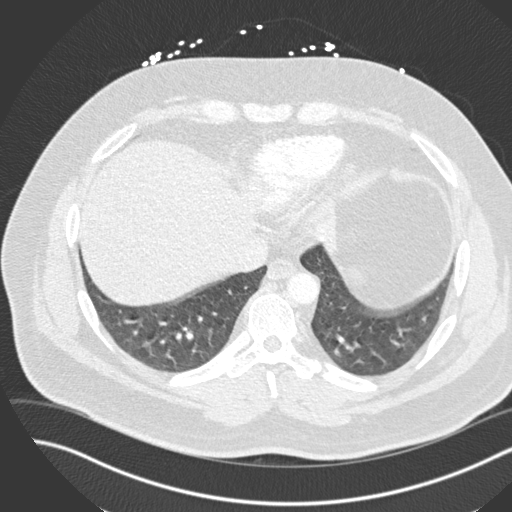
[im 70/256  soft-tissue]
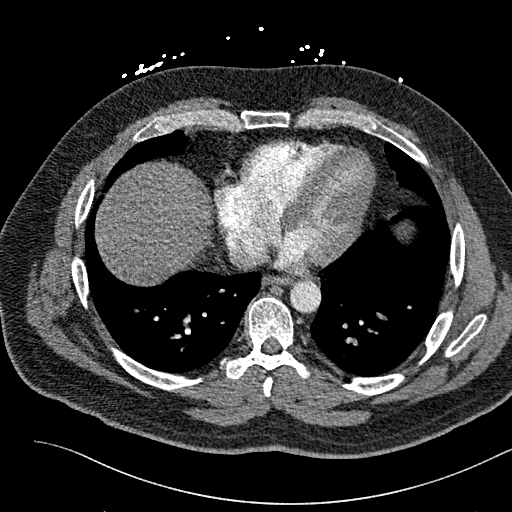
[im 82/256  lung]
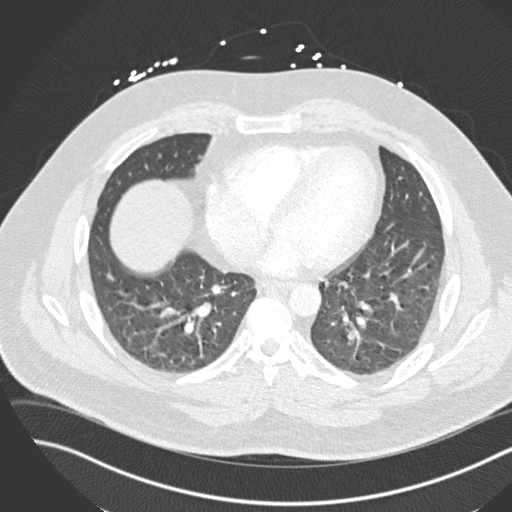
[im 105/256  soft-tissue]
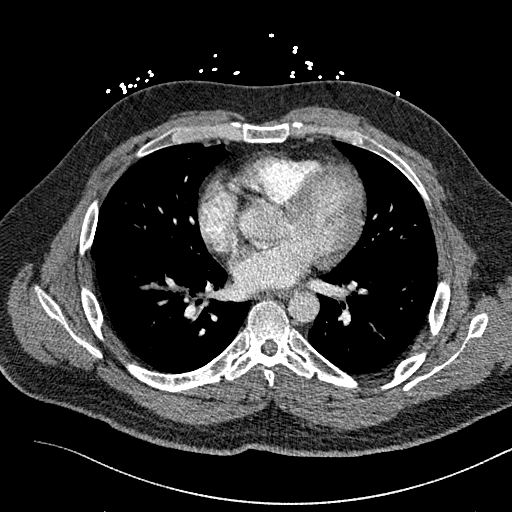
[im 116/256  lung]
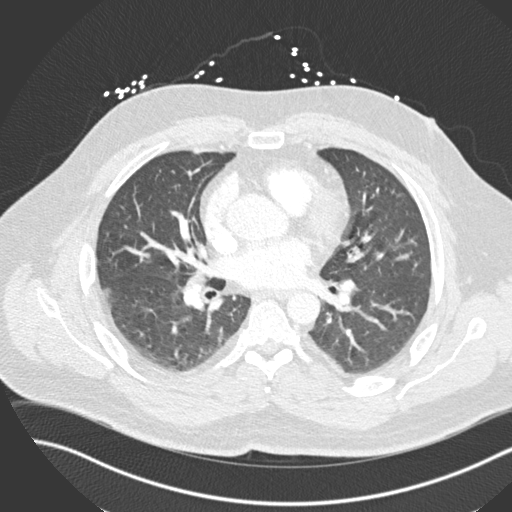
[im 140/256  soft-tissue]
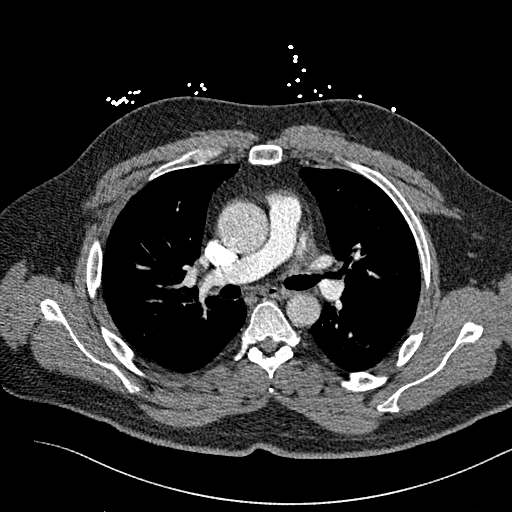
[im 151/256  lung]
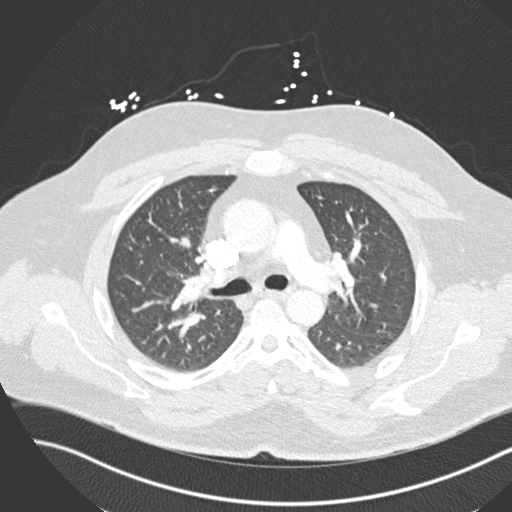
[im 174/256  soft-tissue]
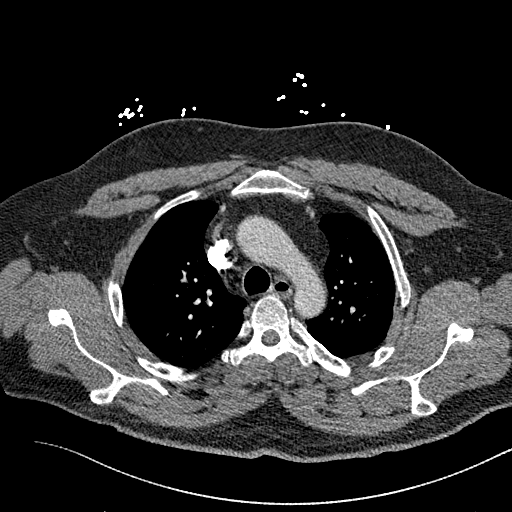
[im 186/256  lung]
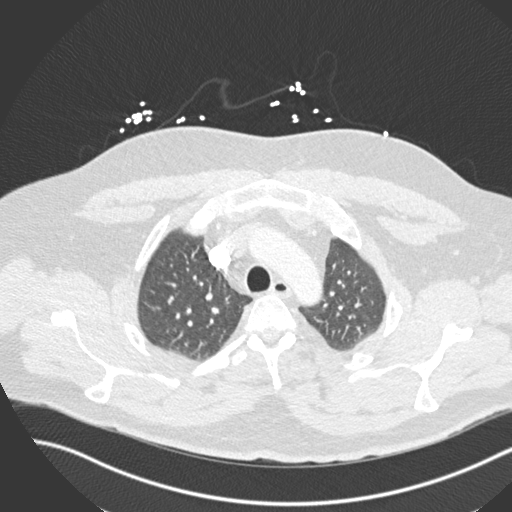
[im 209/256  soft-tissue]
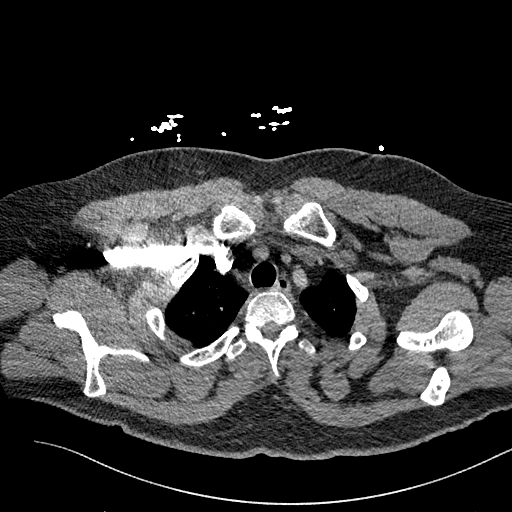
[im 221/256  lung]
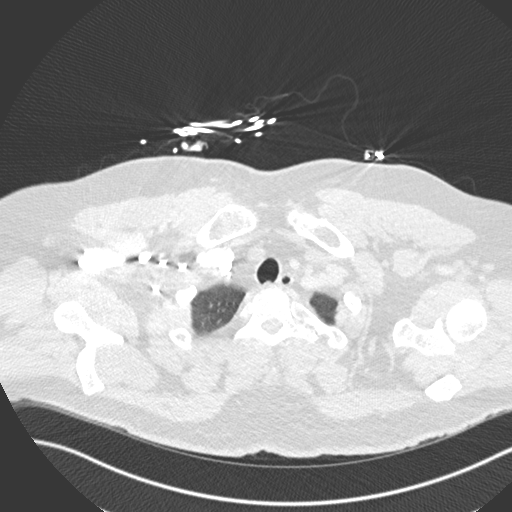
[im 244/256  soft-tissue]
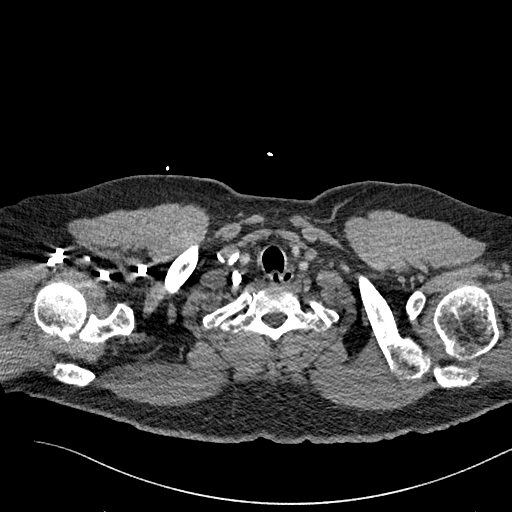

[Series 8: coronal mpr · coronal · 0.51mm/px · 3 of 138 slices shown]
[im 35/138  soft-tissue]
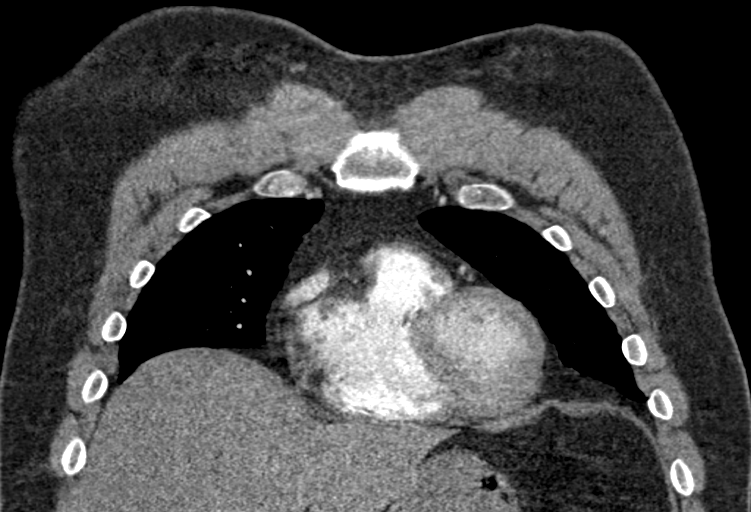
[im 69/138  soft-tissue]
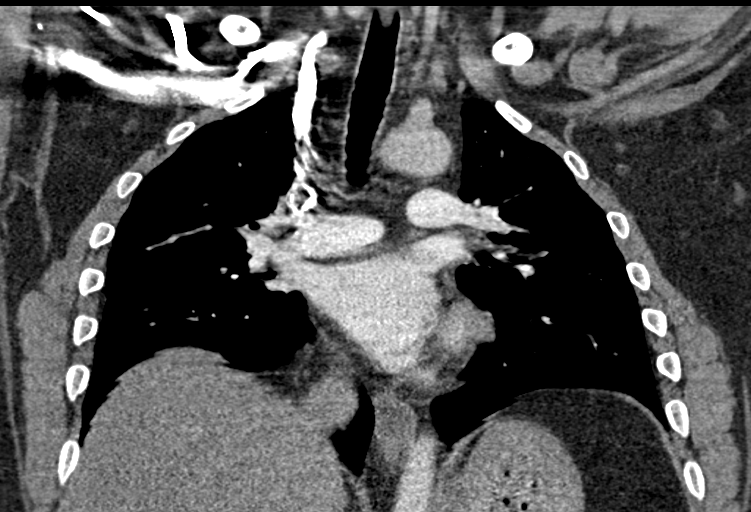
[im 103/138  soft-tissue]
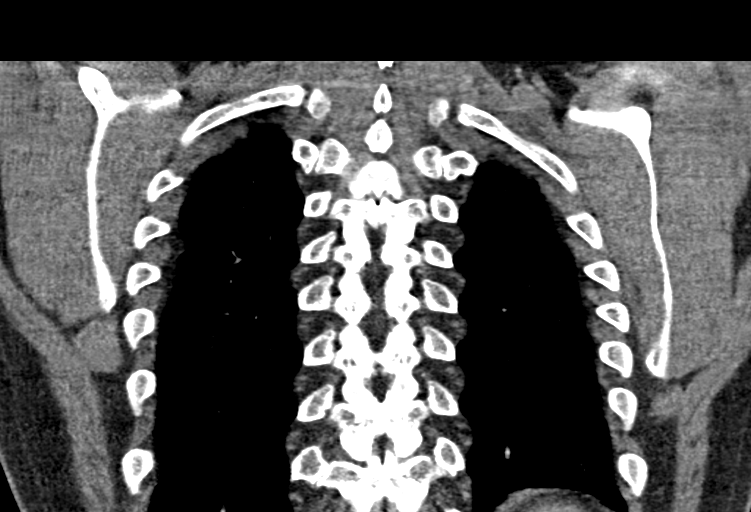

[17 of 46 positions shown; findings below may reference images not displayed]

FINDINGS: Cardiovascular: There is pulmonary embolus within pulmonary arteries
to the lower lobes bilaterally. Evaluation for pulmonary embolus is
somewhat suboptimal due to limitations in the timing of the contrast
bolus.

The RV/LV ratio is just below 0.9, borderline normal.

The heart remains normal in size. The thoracic aorta is grossly
unremarkable. The great vessels are within normal limits. No
calcific atherosclerotic disease is seen.

Mediastinum/Nodes: No mediastinal lymphadenopathy is seen. No
pericardial effusion is identified. The visualized portions of the
thyroid gland are unremarkable. No axillary lymphadenopathy is
appreciated.

Lungs/Pleura: Mild patchy airspace opacities at the central aspect
of the right lung raise concern for infection. No pleural effusion
or pneumothorax is seen. No masses are identified.

Upper Abdomen: The visualized portions of the liver and spleen are
unremarkable.

Musculoskeletal: No acute osseous abnormalities are identified. The
visualized musculature is unremarkable in appearance.

Review of the MIP images confirms the above findings.
IMPRESSION: 1. Pulmonary embolus within pulmonary arteries to the lower lobes
bilaterally. RV/LV ratio is borderline normal, just below 0.9.
2. Mild patchy airspace opacities at the central aspect of the right
lung raise concern for pneumonia.

Critical Value/emergent results were called by telephone at the time
of interpretation on 05/29/2017 at [DATE] to Dr. GOWRI LAGE, who
verbally acknowledged these results.

## 2018-07-20 ENCOUNTER — Other Ambulatory Visit: Payer: Self-pay | Admitting: Internal Medicine

## 2018-07-20 DIAGNOSIS — R0609 Other forms of dyspnea: Principal | ICD-10-CM

## 2018-08-04 ENCOUNTER — Other Ambulatory Visit: Payer: Self-pay | Admitting: Internal Medicine

## 2018-08-04 DIAGNOSIS — R0609 Other forms of dyspnea: Principal | ICD-10-CM

## 2018-08-12 DIAGNOSIS — R7989 Other specified abnormal findings of blood chemistry: Secondary | ICD-10-CM

## 2018-08-12 DIAGNOSIS — Z86718 Personal history of other venous thrombosis and embolism: Secondary | ICD-10-CM

## 2018-08-12 DIAGNOSIS — Z86711 Personal history of pulmonary embolism: Secondary | ICD-10-CM

## 2018-08-12 DIAGNOSIS — R760 Raised antibody titer: Secondary | ICD-10-CM | POA: Diagnosis not present

## 2018-09-15 DIAGNOSIS — R760 Raised antibody titer: Secondary | ICD-10-CM

## 2018-09-15 DIAGNOSIS — Z7982 Long term (current) use of aspirin: Secondary | ICD-10-CM

## 2018-12-05 ENCOUNTER — Other Ambulatory Visit: Payer: Self-pay | Admitting: Oncology

## 2018-12-05 DIAGNOSIS — I2782 Chronic pulmonary embolism: Secondary | ICD-10-CM

## 2019-02-10 ENCOUNTER — Encounter: Payer: 59 | Admitting: Oncology

## 2019-02-16 ENCOUNTER — Encounter: Payer: Self-pay | Admitting: *Deleted

## 2019-07-04 ENCOUNTER — Other Ambulatory Visit: Payer: Self-pay | Admitting: Internal Medicine

## 2019-07-04 DIAGNOSIS — R0609 Other forms of dyspnea: Secondary | ICD-10-CM

## 2020-11-28 ENCOUNTER — Other Ambulatory Visit (HOSPITAL_COMMUNITY): Payer: Self-pay

## 2020-11-30 ENCOUNTER — Encounter: Payer: Self-pay | Admitting: Physician Assistant

## 2020-11-30 DIAGNOSIS — I2699 Other pulmonary embolism without acute cor pulmonale: Secondary | ICD-10-CM | POA: Insufficient documentation

## 2020-12-01 ENCOUNTER — Telehealth (HOSPITAL_COMMUNITY): Payer: Self-pay

## 2020-12-01 ENCOUNTER — Other Ambulatory Visit: Payer: Self-pay | Admitting: Oncology

## 2020-12-01 ENCOUNTER — Ambulatory Visit (HOSPITAL_COMMUNITY): Payer: 59

## 2020-12-01 DIAGNOSIS — U071 COVID-19: Secondary | ICD-10-CM

## 2020-12-01 NOTE — Progress Notes (Signed)
I connected by phone with  Mr. Bache  to discuss the potential use of an new treatment for mild to moderate COVID-19 viral infection in non-hospitalized patients.   This patient is a age/sex that meets the FDA criteria for Emergency Use Authorization of casirivimab\imdevimab.  Has a (+) direct SARS-CoV-2 viral test result 1. Has mild or moderate COVID-19  2. Is ? 45 years of age and weighs ? 40 kg 3. Is NOT hospitalized due to COVID-19 4. Is NOT requiring oxygen therapy or requiring an increase in baseline oxygen flow rate due to COVID-19 5. Is within 10 days of symptom onset 6. Has at least one of the high risk factor(s) for progression to severe COVID-19 and/or hospitalization as defined in EUA. Specific high risk criteria : Past Medical History:  Diagnosis Date  . Anxiety   . Complication of anesthesia    Woke up during surgeries, last surgery  took 1.5 hours to awaken  . 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 History of kidney stones    passed  . History of seasonal allergies   . Morbid obesity ([place])   . Pulmonary emboli ([place])   ?  ?    Symptom onset 11/25/20   I have spoken and communicated the following to the patient or parent/caregiver:   1. FDA has authorized the emergency use of casirivimab\imdevimab for the treatment of mild to moderate COVID-19 in adults and pediatric patients with positive results of direct SARS-CoV-2 viral testing who are 65 years of age and older weighing at least 40 kg, and who are at high risk for progressing to severe COVID-19 and/or hospitalization.   2. The significant known and potential risks and benefits of casirivimab\imdevimab, and the extent to which such potential risks and benefits are unknown.   3. Information on available alternative treatments and the risks and benefits of those alternatives, including clinical trials.   4. Patients treated with casirivimab\imdevimab should continue to self-isolate and use  infection control measures (e.g., wear mask, isolate, social distance, avoid sharing personal items, clean and disinfect "high touch" surfaces, and frequent handwashing) according to CDC guidelines.    5. The patient or parent/caregiver has the option to accept or refuse casirivimab\imdevimab .   After reviewing this information with the patient, The patient agreed to proceed with receiving casirivimab\imdevimab infusion and will be provided a copy of the Fact sheet prior to receiving the infusion.Rulon Abide, AGNP-C [phone] ([place])

## 2020-12-01 NOTE — Telephone Encounter (Signed)
Called to Discuss with patient about Covid symptoms and the use of the monoclonal antibody infusion for those with mild to moderate Covid symptoms and at a high risk of hospitalization.     Pt appears to qualify for this infusion due to co-morbid conditions and/or a member of an at-risk group in accordance with the FDA Emergency Use Authorization.    Pt stated that his symptoms started on 12/10, he tested positive on 12/13 at [facility] and complains of sinus congestion, loss of taste and smell, and is currently afebrile. Qualifying risk factors include PE, BMI>25. Pt stated he has received 2 COVID vaccines and was thinking about his booster vaccine when he became feeling ill. RN discussed cost of treatment with pt and provided CPT code for his insurance. Pt informed that an NP or PA will be calling to verify information and if he qualifies will set up an appointment.

## 2020-12-02 ENCOUNTER — Ambulatory Visit (HOSPITAL_COMMUNITY)
Admission: RE | Admit: 2020-12-02 | Discharge: 2020-12-02 | Disposition: A | Discharge status: Home / Self Care | Payer: 59 | Source: Ambulatory Visit | Attending: Pulmonary Disease | Admitting: Pulmonary Disease

## 2020-12-02 DIAGNOSIS — U071 COVID-19: Secondary | ICD-10-CM | POA: Insufficient documentation

## 2020-12-02 MED ORDER — SODIUM CHLORIDE 0.9 % IV SOLN: INTRAVENOUS | Status: DC | PRN

## 2020-12-02 MED ORDER — DIPHENHYDRAMINE HCL 50 MG/ML IJ SOLN: 50.0000 mg | Freq: Once | INTRAMUSCULAR | Status: DC | PRN

## 2020-12-02 MED ORDER — ALBUTEROL SULFATE HFA 108 (90 BASE) MCG/ACT IN AERS: 2.0000 | INHALATION_SPRAY | Freq: Once | RESPIRATORY_TRACT | Status: DC | PRN

## 2020-12-02 MED ORDER — EPINEPHRINE 0.3 MG/0.3ML IJ SOAJ: 0.3000 mg | Freq: Once | INTRAMUSCULAR | Status: DC | PRN

## 2020-12-02 MED ORDER — METHYLPREDNISOLONE SODIUM SUCC 125 MG IJ SOLR: 125.0000 mg | Freq: Once | INTRAMUSCULAR | Status: DC | PRN

## 2020-12-02 MED ORDER — SODIUM CHLORIDE 0.9 % IV SOLN: Freq: Once | INTRAVENOUS | Status: AC

## 2020-12-02 MED ORDER — FAMOTIDINE IN NACL 20-0.9 MG/50ML-% IV SOLN: 20.0000 mg | Freq: Once | INTRAVENOUS | Status: DC | PRN

## 2020-12-02 NOTE — Progress Notes (Signed)
  Diagnosis: COVID-19  Physician:Dr. Joya Gaskins  Procedure: [facility] Infusion [facility] Med: bamlanivimab\etesevimab infusion - Provided patient with bamlanimivab\etesevimab fact sheet for patients, parents and caregivers prior to infusion.  Complications: No immediate complications noted.  Discharge: Discharged home   Ludwig Lean 12/02/2020

## 2020-12-02 NOTE — Discharge Instructions (Signed)
10 Things You Can Do to Manage Your COVID-19 Symptoms at Home If you have possible or confirmed COVID-19: 1. Stay home from work and school. And stay away from other public places. If you must go out, avoid using any kind of public transportation, ridesharing, or taxis. 2. Monitor your symptoms carefully. If your symptoms get worse, call your healthcare provider immediately. 3. Get rest and stay hydrated. 4. If you have a medical appointment, call the healthcare provider ahead of time and tell them that you have or may have COVID-19. 5. For medical emergencies, call 911 and notify the dispatch personnel that you have or may have COVID-19. 6. Cover your cough and sneezes with a tissue or use the inside of your elbow. 7. Wash your hands often with soap and water for at least 20 seconds or clean your hands with an alcohol-based hand sanitizer that contains at least 60% alcohol. 8. As much as possible, stay in a specific room and away from other people in your home. Also, you should use a separate bathroom, if available. If you need to be around other people in or outside of the home, wear a mask. 9. Avoid sharing personal items with other people in your household, like dishes, towels, and bedding. 10. Clean all surfaces that are touched often, like counters, tabletops, and doorknobs. Use household cleaning sprays or wipes according to the label instructions. cdc.gov/coronavirus 06/17/2019 This information is not intended to replace advice given to you by your health care provider. Make sure you discuss any questions you have with your health care provider. Document Revised: 11/19/2019 Document Reviewed: 11/19/2019 Elsevier Patient Education  2020 Elsevier Inc. What types of side effects do monoclonal antibody drugs cause?  Common side effects  In general, the more common side effects caused by monoclonal antibody drugs include: . Allergic reactions, such as hives or itching . Flu-like signs and  symptoms, including chills, fatigue, fever, and muscle aches and pains . Nausea, vomiting . Diarrhea . Skin rashes . Low blood pressure   The CDC is recommending patients who receive monoclonal antibody treatments wait at least 90 days before being vaccinated.  Currently, there are no data on the safety and efficacy of mRNA COVID-19 vaccines in persons who received monoclonal antibodies or convalescent plasma as part of COVID-19 treatment. Based on the estimated half-life of such therapies as well as evidence suggesting that reinfection is uncommon in the 90 days after initial infection, vaccination should be deferred for at least 90 days, as a precautionary measure until additional information becomes available, to avoid interference of the antibody treatment with vaccine-induced immune responses. If you have any questions or concerns after the infusion please call the Advanced Practice Provider on call at [phone]. This number is ONLY intended for your use regarding questions or concerns about the infusion post-treatment side-effects.  Please do not provide this number to others for use. For return to work notes please contact your primary care provider.   If someone you know is interested in receiving treatment please have them call the COVID hotline at [phone].   

## 2020-12-02 NOTE — Progress Notes (Signed)
Patient reviewed Fact Sheet for Patients, Parents, and Caregivers for Emergency Use Authorization (EUA) of bamlanivimab and etesevimab for the Treatment of Coronavirus. Patient also reviewed and is agreeable to the estimated cost of treatment. Patient is agreeable to proceed.   

## 2021-03-27 ENCOUNTER — Encounter: Payer: Self-pay | Admitting: Gastroenterology

## 2021-04-12 ENCOUNTER — Encounter: Payer: Self-pay | Admitting: Gastroenterology

## 2021-04-12 ENCOUNTER — Ambulatory Visit (INDEPENDENT_AMBULATORY_CARE_PROVIDER_SITE_OTHER): Payer: 59 | Admitting: Gastroenterology

## 2021-04-12 VITALS — BP 118/82 | HR 83 | Ht 76.0 in | Wt 270.8 lb

## 2021-04-12 DIAGNOSIS — Z1211 Encounter for screening for malignant neoplasm of colon: Secondary | ICD-10-CM | POA: Diagnosis not present

## 2021-04-12 DIAGNOSIS — K625 Hemorrhage of anus and rectum: Secondary | ICD-10-CM | POA: Diagnosis not present

## 2021-04-12 MED ORDER — PLENVU 140 G PO SOLR: ORAL | 0 refills | Status: DC

## 2021-04-12 NOTE — Progress Notes (Signed)
04/12/2021 Luis Roberts [id number] Oct 19, 1976   HISTORY OF PRESENT ILLNESS:  This is a 45 year old male who is new to our office.  He is referred here by Dr. Jeryl Columbia for evaluation of rectal bleeding.  He says that at the beginning of April he had an episode of rectal bleeding that lasted several days, 7-10 days.  He says that it was bleeding into his underwear between bowel movements and he had to wear his wife's pads so that he did not ruin his clothes.  He says that it eventually tapered off and has since stopped.  He denies any abdominal pain or rectal pain.  He says that overall he feels well.  He tells me that he had both an EGD and colonoscopy by Dr. Lyda Jester in [place] in his early 57s because of GI issues that he was having.  From what he recalls they were normal.  He tells me that all of his life his bowel habits have not been normal or regular.  He says that he has tried to change his diet and no matter what he eats it still just fluctuates back and forth.  Says that sometimes he will go a couple of days without a bowel movement and then some days he will be in the bathroom to move his bowels several times in a day.  He says that he never feels empty.  Once again, it has been like this forever.  He reports that he used to have issues with acid reflux and was on PPI for a long time, but he lost a lot of weight and has not been having anymore issues.  He took himself off of PPI and continues without any problems at all.   Past Medical History:  Diagnosis Date  . Anxiety   . Complication of anesthesia    Woke up during surgeries, last surgery  took 1.5 hours to awaken  . 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 History of kidney stones    passed  . History of seasonal allergies   . Morbid obesity ([place])   . Pulmonary emboli [facility])    Past Surgical History:  Procedure Laterality Date  . arthroscopic knee surgery    . BACK SURGERY  2007ish   lumbar  laminectomy L5- S1  . COLONOSCOPY    . ESOPHAGOGASTRODUODENOSCOPY    . KNEE SURGERY Left    left knee w/ screw  . LUMBAR LAMINECTOMY/DECOMPRESSION MICRODISCECTOMY Left 04/15/2014   Procedure: LUMBAR LAMINECTOMY/DECOMPRESSION MICRODISCECTOMY 1 LEVEL;  Surgeon: [name], MD;  Location: [place] [facility];  Service: Neurosurgery;  Laterality: Left;  Left Lumbar Four-Five Microdiskectomy  . LUMBAR LAMINECTOMY/DECOMPRESSION MICRODISCECTOMY N/A 05/20/2017   Procedure: LUMBAR LAMINECTOMY/DECOMPRESSION MICRODISCECTOMY LUMBAR FOUR- LUMBAR FIVE;  Surgeon: Newman Pies, MD;  Location: [place];  Service: Neurosurgery;  Laterality: N/A;  LUMBAR LAMINECTOMY/DECOMPRESSION MICRODISCECTOMY LUMBAR 4- LUMBAR 5    reports that he has never smoked. He has never used smokeless tobacco. He reports current alcohol use. He reports that he does not use drugs. family history includes Colon cancer in his maternal grandfather; Hypertension in his mother; Pulmonary embolism (age of onset: 56) in his father; Uterine cancer in his mother. Allergies  Allergen Reactions  . Aleve [Naproxen Sodium] Other (See Comments)    Makes his skin feel like it's crawling      Outpatient Encounter Medications as of 04/12/2021  Medication Sig  . buPROPion (WELLBUTRIN XL) 300 MG  24 hr tablet Take 300 mg by mouth daily.  . clonazePAM (KLONOPIN) 1 MG tablet Take 1 mg by mouth at bedtime as needed for anxiety.  . cyclobenzaprine (FLEXERIL) 10 MG tablet Take 1 tablet (10 mg total) by mouth 3 (three) times daily as needed for muscle spasms.  Marland Kitchen escitalopram (LEXAPRO) 10 MG tablet Take 10 mg by mouth daily.  . fexofenadine (ALLEGRA) 180 MG tablet Take 180 mg by mouth daily.  . fluticasone (FLONASE) 50 MCG/ACT nasal spray Place 1 spray into both nostrils daily as needed for allergies or rhinitis.  Marland Kitchen gabapentin (NEURONTIN) 600 MG tablet Take 1 tablet by mouth 3 (three) times daily. As needed  . lisinopril (ZESTRIL) 2.5 MG tablet Take 2.5 mg by  mouth daily.  Marland Kitchen loratadine (CLARITIN) 10 MG tablet Take 10 mg by mouth daily as needed for allergies.   Marland Kitchen oxyCODONE (ROXICODONE) 15 MG immediate release tablet Take 1 tablet (15 mg total) by mouth every 4 (four) hours as needed for moderate pain.  Marland Kitchen PROAIR HFA 108 (90 Base) MCG/ACT inhaler Inhale 2 puffs into the lungs every 4 (four) hours as needed.  . testosterone cypionate (DEPOTESTOTERONE CYPIONATE) 100 MG/ML injection Inject 200 mg into the muscle every 14 (fourteen) days. For IM use only 1 injection weekly  . topiramate (TOPAMAX) 50 MG tablet Take 50 mg by mouth daily.  . [DISCONTINUED] famotidine (PEPCID) 20 MG tablet TAKE ONE TABLET BY MOUTH AT BEDTIME (Patient not taking: Reported on 04/12/2021)  . [DISCONTINUED] pantoprazole (PROTONIX) 40 MG tablet TAKE ONE TABLET BY MOUTH EVERY DAY, TAKE 30 to 60 mins before first meal of the day (Patient not taking: Reported on 04/12/2021)  . [DISCONTINUED] Rivaroxaban (XARELTO) 15 MG TABS tablet Take 15 mg by mouth 2 (two) times daily with a meal.   No facility-administered encounter medications on file as of 04/12/2021.     REVIEW OF SYSTEMS  : All other systems reviewed and negative except where noted in the History of Present Illness.   PHYSICAL EXAM: BP 118/82 (BP Location: Left Arm, Patient Position: Sitting)   Pulse 83   Ht 6\' 4"  (1.93 m)   Wt 270 lb 12.8 oz (122.8 kg)   SpO2 94%   BMI 32.96 kg/m  General: Well developed white male in no acute distress Head: Normocephalic and atraumatic Eyes:  Sclerae anicteric, conjunctiva pink. Ears: Normal auditory acuity Lungs: Clear throughout to auscultation; no W/R/R. Heart: Regular rate and rhythm; no M/R/G. Abdomen: Soft, non-distended.  BS present.  Non-tender. Rectal:  Will be done at the time of colonoscopy. Musculoskeletal: Symmetrical with no gross deformities  Skin: No lesions on visible extremities Extremities: No edema  Neurological: Alert oriented x 4, grossly  non-focal Psychological:  Alert and cooperative. Normal mood and affect  ASSESSMENT AND PLAN: *Rectal bleeding:  Had an episode of bright red rectal bleeding that lasted several days earlier this month.  Has resolved now.  Will plan for colonoscopy with Dr. Havery Moros.  He had a colonoscopy in his early 49s but none since that time.  The risks, benefits, and alternatives to colonoscopy were discussed with the patient and he consents to proceed. *CRC screening:  Colonoscopy as above.   CC:  Cyndy Freeze, MD  CC:  Dr. Jeryl Columbia

## 2021-04-12 NOTE — Patient Instructions (Signed)
If you are age 46 or older, your body mass index should be between 23-30. Your Body mass index is 32.96 kg/m. If this is out of the aforementioned range listed, please consider follow up with your Primary Care Provider.  If you are age 27 or younger, your body mass index should be between 19-25. Your Body mass index is 32.96 kg/m. If this is out of the aformentioned range listed, please consider follow up with your Primary Care Provider.   You have been scheduled for a colonoscopy. Please follow written instructions given to you at your visit today.  Please pick up your prep supplies at the pharmacy within the next 1-3 days. If you use inhalers (even only as needed), please bring them with you on the day of your procedure.  Thank you for choosing me and [facility].  Alonza Bogus , PA-C

## 2021-04-12 NOTE — Progress Notes (Signed)
Agree with assessment and plan as outlined.  

## 2021-04-18 ENCOUNTER — Other Ambulatory Visit: Payer: Self-pay

## 2021-04-18 ENCOUNTER — Encounter: Payer: Self-pay | Admitting: Gastroenterology

## 2021-04-18 ENCOUNTER — Ambulatory Visit (AMBULATORY_SURGERY_CENTER): Payer: 59 | Admitting: Gastroenterology

## 2021-04-18 VITALS — BP 100/63 | HR 56 | Temp 98.0°F | Resp 16 | Ht 76.0 in | Wt 270.0 lb

## 2021-04-18 DIAGNOSIS — K648 Other hemorrhoids: Secondary | ICD-10-CM | POA: Diagnosis not present

## 2021-04-18 DIAGNOSIS — K625 Hemorrhage of anus and rectum: Secondary | ICD-10-CM | POA: Diagnosis not present

## 2021-04-18 DIAGNOSIS — D122 Benign neoplasm of ascending colon: Secondary | ICD-10-CM | POA: Diagnosis not present

## 2021-04-18 DIAGNOSIS — D123 Benign neoplasm of transverse colon: Secondary | ICD-10-CM | POA: Diagnosis not present

## 2021-04-18 MED ORDER — SODIUM CHLORIDE 0.9 % IV SOLN: 500.0000 mL | Freq: Once | INTRAVENOUS | Status: DC

## 2021-04-18 MED ORDER — SODIUM CHLORIDE 0.9 % IV SOLN
500.0000 mL | Freq: Once | INTRAVENOUS | Status: DC
Start: 2021-04-18 — End: 2021-04-18

## 2021-04-18 NOTE — Progress Notes (Signed)
VS by   I have reviewed the patient's medical history in detail and updated the computerized patient record.  

## 2021-04-18 NOTE — Op Note (Signed)
[facility] Patient Name: Luis Roberts Procedure Date: 04/18/2021 10:56 AM MRN: [id number] Endoscopist: Remo Lipps P. Havery Moros , MD Age: 45 Referring MD:  Date of Birth: [birth year] Gender: Male Account #: [id number] Procedure:                Colonoscopy Indications:              Hematochezia - multiple episodes 1 month ago, has                            since resolved Medicines:                Monitored Anesthesia Care Procedure:                Pre-Anesthesia Assessment:                           - Prior to the procedure, a History and Physical                            was performed, and patient medications and                            allergies were reviewed. The patient's tolerance of                            previous anesthesia was also reviewed. The risks                            and benefits of the procedure and the sedation                            options and risks were discussed with the patient.                            All questions were answered, and informed consent                            was obtained. Prior Anticoagulants: The patient has                            taken no previous anticoagulant or antiplatelet                            agents. ASA Grade Assessment: II - A patient with                            mild systemic disease. After reviewing the risks                            and benefits, the patient was deemed in                            satisfactory condition to undergo the procedure.  After obtaining informed consent, the colonoscope                            was passed under direct vision. Throughout the                            procedure, the patient's blood pressure, pulse, and                            oxygen saturations were monitored continuously. The                            Olympus CF-HQ190 [id number]) Colonoscope was                            introduced through the anus and advanced to the  the                            terminal ileum, with identification of the                            appendiceal orifice and IC valve. The colonoscopy                            was performed without difficulty. The patient                            tolerated the procedure well. The quality of the                            bowel preparation was good. The terminal ileum,                            ileocecal valve, appendiceal orifice, and rectum                            were photographed. Scope In: 11:03:30 AM Scope Out: 11:26:38 AM Scope Withdrawal Time: 0 hours 20 minutes 18 seconds  Total Procedure Duration: 0 hours 23 minutes 8 seconds  Findings:                 The perianal and digital rectal examinations were                            normal.                           The terminal ileum appeared normal.                           A 4 mm polyp was found in the ascending colon. The                            polyp was sessile. The polyp was removed with a  cold snare. Resection and retrieval were complete.                           Two flat and sessile polyps were found in the                            transverse colon. The polyps were 3 to 5 mm in                            size. These polyps were removed with a cold snare.                            Resection and retrieval were complete.                           Internal hemorrhoids were found during retroflexion.                           The exam was otherwise without abnormality. Complications:            No immediate complications. Estimated blood loss:                            Minimal. Estimated Blood Loss:     Estimated blood loss was minimal. Impression:               - The examined portion of the ileum was normal.                           - One 4 mm polyp in the ascending colon, removed                            with a cold snare. Resected and retrieved.                           - Two 3  to 5 mm polyps in the transverse colon,                            removed with a cold snare. Resected and retrieved.                           - Internal hemorrhoids.                           - The examination was otherwise normal.                           Suspect the patient had hemorrhoidal bleeding as                            cause for symptoms Recommendation:           - Patient has a contact number available for  emergencies. The signs and symptoms of potential                            delayed complications were discussed with the                            patient. Return to normal activities tomorrow.                            Written discharge instructions were provided to the                            patient.                           - Resume previous diet.                           - Continue present medications.                           - Await pathology results.                           - Daily fiber supplement such as Citrucel once                            daily. If symptoms recur / persist, please contact                            me can discuss other options for treatment (banding) Remo Lipps P. Landrum Carbonell, MD 04/18/2021 11:31:02 AM This report has been signed electronically.

## 2021-04-18 NOTE — Progress Notes (Signed)
Called to room to assist during endoscopic procedure.  Patient ID and intended procedure confirmed with present staff. Received instructions for my participation in the procedure from the performing physician.  

## 2021-04-18 NOTE — Progress Notes (Signed)
PT taken to PACU. Monitors in place. VSS. Report given to RN. 

## 2021-04-18 NOTE — Patient Instructions (Signed)
Await pathology results on polyps removed   Handouts on polyps & hemorrhoids given to you today  Daily fiber supplement such as Citrucel daily If these abnormal clinical findings persist, appropriate workup will be completed. The patient understands that follow up is required to elucidate the situation. Symptoms recur or persist contact Dr Doyne Keel office for other options     YOU HAD AN ENDOSCOPIC PROCEDURE TODAY AT [facility]:   Refer to the procedure report that was given to you for any specific questions about what was found during the examination.  If the procedure report does not answer your questions, please call your gastroenterologist to clarify.  If you requested that your care partner not be given the details of your procedure findings, then the procedure report has been included in a sealed envelope for you to review at your convenience later.  YOU SHOULD EXPECT: Some feelings of bloating in the abdomen. Passage of more gas than usual.  Walking can help get rid of the air that was put into your GI tract during the procedure and reduce the bloating. If you had a lower endoscopy (such as a colonoscopy or flexible sigmoidoscopy) you may notice spotting of blood in your stool or on the toilet paper. If you underwent a bowel prep for your procedure, you may not have a normal bowel movement for a few days.  Please Note:  You might notice some irritation and congestion in your nose or some drainage.  This is from the oxygen used during your procedure.  There is no need for concern and it should clear up in a day or so.  SYMPTOMS TO REPORT IMMEDIATELY:   Following lower endoscopy (colonoscopy or flexible sigmoidoscopy):  Excessive amounts of blood in the stool  Significant tenderness or worsening of abdominal pains  Swelling of the abdomen that is new, acute  Fever of 100F or higher   For urgent or emergent issues, a gastroenterologist can be reached at any hour  by calling [phone]. Do not use MyChart messaging for urgent concerns.    DIET:  We do recommend a small meal at first, but then you may proceed to your regular diet.  Drink plenty of fluids but you should avoid alcoholic beverages for 24 hours.  ACTIVITY:  You should plan to take it easy for the rest of today and you should NOT DRIVE or use heavy machinery until tomorrow (because of the sedation medicines used during the test).    FOLLOW UP: Our staff will call the number listed on your records 48-72 hours following your procedure to check on you and address any questions or concerns that you may have regarding the information given to you following your procedure. If we do not reach you, we will leave a message.  We will attempt to reach you two times.  During this call, we will ask if you have developed any symptoms of COVID 19. If you develop any symptoms (ie: fever, flu-like symptoms, shortness of breath, cough etc.) before then, please call [phone].  If you test positive for Covid 19 in the 2 weeks post procedure, please call and report this information to [place].    If any biopsies were taken you will be contacted by phone or by letter within the next 1-3 weeks.  Please call us at [phone] if you have not heard about the biopsies in 3 weeks.    SIGNATURES/CONFIDENTIALITY: You and/or your care partner have signed paperwork which will  be entered into your electronic medical record.  These signatures attest to the fact that that the information above on your After Visit Summary has been reviewed and is understood.  Full responsibility of the confidentiality of this discharge information lies with you and/or your care-partner. 

## 2021-04-20 ENCOUNTER — Telehealth: Payer: Self-pay

## 2021-04-20 NOTE — Telephone Encounter (Signed)
  Follow up Call-  Call back number 04/18/2021  Post procedure Call Back phone  # [phone]  Permission to leave phone message Yes  Some recent data might be hidden     Patient questions:  Do you have a fever, pain , or abdominal swelling? No. Pain Score  0 *  Have you tolerated food without any problems? Yes.    Have you been able to return to your normal activities? Yes.    Do you have any questions about your discharge instructions: Diet   No. Medications  No. Follow up visit  No.  Do you have questions or concerns about your Care? No.  Actions: * If pain score is 4 or above: No action needed, pain <4.  1. Have you developed a fever since your procedure? no  2.   Have you had an respiratory symptoms (SOB or cough) since your procedure? no  3.   Have you tested positive for COVID 19 since your procedure no  4.   Have you had any family members/close contacts diagnosed with the COVID 19 since your procedure?  no   If yes to any of these questions please route to Joylene John, RN and Joella Prince, RN

## 2021-04-27 ENCOUNTER — Encounter: Payer: Self-pay | Admitting: Gastroenterology

## 2022-01-18 ENCOUNTER — Ambulatory Visit (INDEPENDENT_AMBULATORY_CARE_PROVIDER_SITE_OTHER): Payer: 59 | Admitting: Gastroenterology

## 2022-01-18 ENCOUNTER — Encounter: Payer: Self-pay | Admitting: Gastroenterology

## 2022-01-18 VITALS — BP 112/80 | HR 94 | Ht 77.0 in | Wt 285.0 lb

## 2022-01-18 DIAGNOSIS — R09A2 Foreign body sensation, throat: Secondary | ICD-10-CM

## 2022-01-18 DIAGNOSIS — R0989 Other specified symptoms and signs involving the circulatory and respiratory systems: Secondary | ICD-10-CM

## 2022-01-18 DIAGNOSIS — R059 Cough, unspecified: Secondary | ICD-10-CM | POA: Diagnosis not present

## 2022-01-18 DIAGNOSIS — K219 Gastro-esophageal reflux disease without esophagitis: Secondary | ICD-10-CM

## 2022-01-18 MED ORDER — FAMOTIDINE 20 MG PO TABS: 20.0000 mg | ORAL_TABLET | Freq: Every day | ORAL | 3 refills | Status: DC

## 2022-01-18 MED ORDER — PANTOPRAZOLE SODIUM 40 MG PO TBEC: 40.0000 mg | DELAYED_RELEASE_TABLET | Freq: Two times a day (BID) | ORAL | 3 refills | Status: DC

## 2022-01-18 NOTE — Patient Instructions (Signed)
We have sent the following medications to your pharmacy for you to pick up at your convenience: Pantoprazole 40 mg twice daily 30-60 minutes before breakfast and dinner.  Pepcid 20 mg nightly 30-60 minutes before bedtime.   You have been scheduled for an endoscopy. Please follow written instructions given to you at your visit today. If you use inhalers (even only as needed), please bring them with you on the day of your procedure.  If you are age 47 or older, your body mass index should be between 23-30. Your Body mass index is 33.8 kg/m. If this is out of the aforementioned range listed, please consider follow up with your Primary Care Provider.  If you are age 47 or younger, your body mass index should be between 19-25. Your Body mass index is 33.8 kg/m. If this is out of the aformentioned range listed, please consider follow up with your Primary Care Provider.   ________________________________________________________  The [facility] providers would like to encourage you to use [facility] to communicate with providers for non-urgent requests or questions.  Due to long hold times on the telephone, sending your provider a message by [facility] may be a faster and more efficient way to get a response.  Please allow 48 business hours for a response.  Please remember that this is for non-urgent requests.  _______________________________________________________

## 2022-01-18 NOTE — Progress Notes (Signed)
01/18/2022 Luis Roberts [id number] 01-05-1976   HISTORY OF PRESENT ILLNESS: This is a 46 year old male who is a patient of Dr. Doyne Keel.  He had only been seen by me back in April 2022 for complaints of rectal bleeding.  He subsequently underwent a colonoscopy.  At that visit he told me that previously he used to have a lot of issues with acid reflux and was on a PPI for a long time, but he lost a lot of weight and had not been having any more issues even after taking himself off of PPI.  He had previously had an EGD with Dr. Georgia Duff his early 59s because of those issues.  Now he returns here today stating that for the past 6 months or so he has been having a lot of heartburn/reflux/indigestion again.  He feels like there is something in his throat/globus sensation.  He has been treated for what sounds like presumable pneumonia 3 times over the past several months.  He said that on 1 occasion they did a chest x-ray and it showed pneumonia.  He says that at nighttime he will wake up choking.  He says he has had issues with his sinuses and is wondering if that could be coming from acid reflux.  His PCP put him back on omeprazole 20 mg daily, Nexium 20 mg daily, Pepcid at bedtime.  No dysphagia.  Clears his throat a lot.  Some epigastric discomfort.  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   History of kidney stones    passed   History of seasonal allergies    Morbid obesity ([place])    Pulmonary emboli [facility])    Past Surgical History:  Procedure Laterality Date   arthroscopic knee surgery     BACK SURGERY  2007ish   lumbar laminectomy L5- S1   COLONOSCOPY     ESOPHAGOGASTRODUODENOSCOPY     KNEE SURGERY Left    left knee w/ screw   LUMBAR LAMINECTOMY/DECOMPRESSION MICRODISCECTOMY Left 04/15/2014    Procedure: LUMBAR LAMINECTOMY/DECOMPRESSION MICRODISCECTOMY 1 LEVEL;  Surgeon: [name], MD;  Location: [facility];  Service: Neurosurgery;  Laterality: Left;  Left Lumbar Four-Five Microdiskectomy   LUMBAR LAMINECTOMY/DECOMPRESSION MICRODISCECTOMY N/A 05/20/2017   Procedure: LUMBAR LAMINECTOMY/DECOMPRESSION MICRODISCECTOMY LUMBAR FOUR- LUMBAR FIVE;  Surgeon: Newman Pies, MD;  Location: [facility];  Service: Neurosurgery;  Laterality: N/A;  LUMBAR LAMINECTOMY/DECOMPRESSION MICRODISCECTOMY LUMBAR 4- LUMBAR 5    reports that he has never smoked. He has never used smokeless tobacco. He reports current alcohol use. He reports that he does not use drugs. family history includes Colon cancer in his maternal grandfather; Hypertension in his mother; Pulmonary embolism (age of onset: 51) in his father; Uterine cancer in his mother. Allergies  Allergen Reactions   Aleve [Naproxen Sodium] Other (See Comments)    Makes his skin feel like it's crawling      Outpatient Encounter Medications as of 01/18/2022  Medication Sig   esomeprazole (NEXIUM) 20 MG capsule    buPROPion (WELLBUTRIN XL) 300 MG 24 hr tablet Take 300 mg by mouth daily.   clonazePAM (KLONOPIN) 1 MG tablet Take 1 mg by mouth at bedtime as needed for anxiety.   cyclobenzaprine (FLEXERIL) 10 MG tablet Take 1 tablet (10 mg total) by mouth 3 (  three) times daily as needed for muscle spasms.   escitalopram (LEXAPRO) 10 MG tablet Take 10 mg by mouth daily.   famotidine (PEPCID) 20 MG tablet Take 20 mg by mouth 2 (two) times daily.   fexofenadine (ALLEGRA) 180 MG tablet Take 180 mg by mouth daily.   fluticasone (FLONASE) 50 MCG/ACT nasal spray Place 1 spray into both nostrils daily as needed for allergies or rhinitis.   gabapentin (NEURONTIN) 600 MG tablet Take 1 tablet by mouth 3 (three) times daily. As needed   lisinopril (ZESTRIL) 2.5 MG tablet Take 2.5 mg by mouth daily.   loratadine (CLARITIN) 10 MG tablet Take 10 mg by mouth daily as  needed for allergies.    omeprazole (PRILOSEC) 20 MG capsule Take by mouth every morning.   oxyCODONE (ROXICODONE) 15 MG immediate release tablet Take 1 tablet (15 mg total) by mouth every 4 (four) hours as needed for moderate pain.   PROAIR HFA 108 (90 Base) MCG/ACT inhaler Inhale 2 puffs into the lungs every 4 (four) hours as needed.   testosterone cypionate (DEPOTESTOTERONE CYPIONATE) 100 MG/ML injection Inject 200 mg into the muscle every 14 (fourteen) days. For IM use only 1 injection weekly   topiramate (TOPAMAX) 50 MG tablet Take 50 mg by mouth daily.   No facility-administered encounter medications on file as of 01/18/2022.     REVIEW OF SYSTEMS  : All other systems reviewed and negative except where noted in the History of Present Illness.   PHYSICAL EXAM: BP 112/80    Pulse 94    Ht 6\' 5"  (1.956 m)    Wt 285 lb (129.3 kg)    SpO2 97%    BMI 33.80 kg/m  General: Well developed white male in no acute distress Head: Normocephalic and atraumatic Eyes:  Sclerae anicteric, conjunctiva pink. Ears: Normal auditory acuity Lungs: Clear throughout to auscultation; no W/R/R. Heart: Regular rate and rhythm; no M/R/G. Abdomen: Soft, non-distended.  BS present.  Minimal epigastric TTP. Musculoskeletal: Symmetrical with no gross deformities  Skin: No lesions on visible extremities Extremities: No edema  Neurological: Alert oriented x 4, grossly non-focal Psychological:  Alert and cooperative. Normal mood and affect  ASSESSMENT AND PLAN: *GERD, globus sensation, cough:  ? If he is aspirating at night time as well.  Has been treated for at least presumed pneumonia 3 times in the past several months.  When I had seen him in early 2022 he had reported that since he lost weight he had come off of his PPI and was not having any issues with his acid reflux.  Currently on omeprazole 20 mg daily, Nexium 20 mg daily, and Pepcid at bedtime.  He says that previously he taken pantoprazole and had great  results with that..  We will have him begin pantoprazole 40 mg twice daily and Pepcid 20 mg at bedtime.  We will send prescriptions to his pharmacy.  Once he gets symptoms under good control that we can back down to once daily dosing with the PPI.  We will plan for EGD with Dr. Havery Moros.  The risks, benefits, and alternatives to EGD were discussed with the patient and he consents to proceed.  We also discussed sleeping with the head of his bed raised, making sure he is not eating and lying down for at least 3 hours between, eating smaller more frequent meals, etc.   CC:  Cyndy Freeze, MD

## 2022-01-19 NOTE — Progress Notes (Signed)
Agree with assessment and plan as outlined.  

## 2022-02-20 ENCOUNTER — Encounter: Payer: Self-pay | Admitting: Gastroenterology

## 2022-02-22 ENCOUNTER — Encounter: Payer: Self-pay | Admitting: Gastroenterology

## 2022-02-22 ENCOUNTER — Ambulatory Visit (AMBULATORY_SURGERY_CENTER): Payer: 59 | Admitting: Gastroenterology

## 2022-02-22 ENCOUNTER — Other Ambulatory Visit: Payer: Self-pay

## 2022-02-22 VITALS — BP 92/65 | HR 81 | Temp 98.0°F | Resp 14 | Ht 77.0 in | Wt 285.0 lb

## 2022-02-22 DIAGNOSIS — R1013 Epigastric pain: Secondary | ICD-10-CM | POA: Diagnosis not present

## 2022-02-22 DIAGNOSIS — R110 Nausea: Secondary | ICD-10-CM | POA: Diagnosis present

## 2022-02-22 DIAGNOSIS — K449 Diaphragmatic hernia without obstruction or gangrene: Secondary | ICD-10-CM | POA: Diagnosis not present

## 2022-02-22 DIAGNOSIS — K319 Disease of stomach and duodenum, unspecified: Secondary | ICD-10-CM | POA: Diagnosis not present

## 2022-02-22 DIAGNOSIS — K3189 Other diseases of stomach and duodenum: Secondary | ICD-10-CM | POA: Diagnosis not present

## 2022-02-22 DIAGNOSIS — K219 Gastro-esophageal reflux disease without esophagitis: Secondary | ICD-10-CM

## 2022-02-22 MED ORDER — SODIUM CHLORIDE 0.9 % IV SOLN: 500.0000 mL | Freq: Once | INTRAVENOUS | Status: DC

## 2022-02-22 MED ORDER — SUCRALFATE 1 G PO TABS: 1.0000 g | ORAL_TABLET | Freq: Four times a day (QID) | ORAL | 1 refills | Status: DC

## 2022-02-22 MED ORDER — PANTOPRAZOLE SODIUM 40 MG PO TBEC: 40.0000 mg | DELAYED_RELEASE_TABLET | Freq: Every day | ORAL | 1 refills | Status: DC

## 2022-02-22 MED ORDER — FAMOTIDINE 20 MG PO TABS: 20.0000 mg | ORAL_TABLET | Freq: Every day | ORAL | 1 refills | Status: DC

## 2022-02-22 NOTE — Patient Instructions (Signed)
YOU HAD AN ENDOSCOPIC PROCEDURE TODAY AT THE [facility]:   Refer to the procedure report that was given to you for any specific questions about what was found during the examination.  If the procedure report does not answer your questions, please call your gastroenterologist to clarify.  If you requested that your care partner not be given the details of your procedure findings, then the procedure report has been included in a sealed envelope for you to review at your convenience later.  YOU SHOULD EXPECT: Some feelings of bloating in the abdomen. Passage of more gas than usual.  Walking can help get rid of the air that was put into your GI tract during the procedure and reduce the bloating. If you had a lower endoscopy (such as a colonoscopy or flexible sigmoidoscopy) you may notice spotting of blood in your stool or on the toilet paper. If you underwent a bowel prep for your procedure, you may not have a normal bowel movement for a few days.  Please Note:  You might notice some irritation and congestion in your nose or some drainage.  This is from the oxygen used during your procedure.  There is no need for concern and it should clear up in a day or so.  SYMPTOMS TO REPORT IMMEDIATELY:   Following lower endoscopy (colonoscopy or flexible sigmoidoscopy):  Excessive amounts of blood in the stool  Significant tenderness or worsening of abdominal pains  Swelling of the abdomen that is new, acute  Fever of 100F or higher   Following upper endoscopy (EGD)  Vomiting of blood or coffee ground material  New chest pain or pain under the shoulder blades  Painful or persistently difficult swallowing  New shortness of breath  Fever of 100F or higher  Black, tarry-looking stools  For urgent or emergent issues, a gastroenterologist can be reached at any hour by calling [phone]. Do not use MyChart messaging for urgent concerns.    DIET:  We do recommend a small meal at first, but  then you may proceed to your regular diet.  Drink plenty of fluids but you should avoid alcoholic beverages for 24 hours.  ACTIVITY:  You should plan to take it easy for the rest of today and you should NOT DRIVE or use heavy machinery until tomorrow (because of the sedation medicines used during the test).    FOLLOW UP: Our staff will call the number listed on your records 48-72 hours following your procedure to check on you and address any questions or concerns that you may have regarding the information given to you following your procedure. If we do not reach you, we will leave a message.  We will attempt to reach you two times.  During this call, we will ask if you have developed any symptoms of COVID 19. If you develop any symptoms (ie: fever, flu-like symptoms, shortness of breath, cough etc.) before then, please call [phone].  If you test positive for Covid 19 in the 2 weeks post procedure, please call and report this information to us.    If any biopsies were taken you will be contacted by phone or by letter within the next 1-3 weeks.  Please call us at [phone] if you have not heard about the biopsies in 3 weeks.    SIGNATURES/CONFIDENTIALITY: You and/or your care partner have signed paperwork which will be entered into your electronic medical record.  These signatures attest to the fact that that the information above on   your After Visit Summary has been reviewed and is understood.  Full responsibility of the confidentiality of this discharge information lies with you and/or your care-partner. 

## 2022-02-22 NOTE — Op Note (Signed)
[facility] ?Patient Name: Luis Roberts ?Procedure Date: 02/22/2022 2:41 PM ?MRN: [id number] ?Endoscopist: Carlota Raspberry. Havery Moros , MD ?Age: 46 ?Referring MD:  ?Date of Birth: Oct 25, 1976 ?Gender: Male ?Account #: [id number] ?Procedure:                Upper GI endoscopy ?Indications:              Epigastric abdominal pain, Suspected  ?                          gastro-esophageal reflux disease, Nausea -  ?                          significant symptoms on low dose PPI, now on  ?                          protonix '40mg'$  BID and pepcid '20mg'$  q HS with 60%  ?                          improvement - still having some breakthrough and  ?                          nocturnal symptoms (regurgitation), history of  ?                          pneumonia - possible aspiration ?Medicines:                Monitored Anesthesia Care ?Procedure:                Pre-Anesthesia Assessment: ?                          - Prior to the procedure, a History and Physical  ?                          was performed, and patient medications and  ?                          allergies were reviewed. The patient's tolerance of  ?                          previous anesthesia was also reviewed. The risks  ?                          and benefits of the procedure and the sedation  ?                          options and risks were discussed with the patient.  ?                          All questions were answered, and informed consent  ?                          was obtained. Prior Anticoagulants: The patient has  ?  taken no previous anticoagulant or antiplatelet  ?                          agents. ASA Grade Assessment: II - A patient with  ?                          mild systemic disease. After reviewing the risks  ?                          and benefits, the patient was deemed in  ?                          satisfactory condition to undergo the procedure. ?                          After obtaining informed consent, the endoscope was   ?                          passed under direct vision. Throughout the  ?                          procedure, the patient's blood pressure, pulse, and  ?                          oxygen saturations were monitored continuously. The  ?                          GIF-HQ190 Olympus #[id number] was introduced through  ?                          the mouth, and advanced to the second part of  ?                          duodenum. The upper GI endoscopy was accomplished  ?                          without difficulty. The patient tolerated the  ?                          procedure well. ?Scope In: ?Scope Out: ?Findings:                 Esophagogastric landmarks were identified: the  ?                          Z-line was found at 41 cm, the gastroesophageal  ?                          junction was found at 41 cm and the upper extent of  ?                          the gastric folds was found at 43 cm from the  ?  incisors. ?                          A 2 cm hiatal hernia was present. ?                          The gastroesophageal flap valve was visualized  ?                          endoscopically and classified as Hill Grade I  ?                          (prominent fold, tight to endoscope). ?                          The exam of the esophagus was otherwise normal. No  ?                          Barrett's ?                          The entire examined stomach was normal. Biopsies  ?                          were taken with a cold forceps for Helicobacter  ?                          pylori testing. ?                          The duodenal bulb and second portion of the  ?                          duodenum were normal. ?Complications:            No immediate complications. Estimated blood loss:  ?                          Minimal. ?Estimated Blood Loss:     Estimated blood loss was minimal. ?Impression:               - Esophagogastric landmarks identified. ?                          - 2 cm hiatal hernia. ?                           - Gastroesophageal flap valve classified as Hill  ?                          Grade I (prominent fold, tight to endoscope). ?                          - Normal stomach. Biopsied. ?                          - Normal duodenal bulb and second portion of the  ?  duodenum. ?                          If reflux is driving symptoms, will discuss options  ?                          with patient. Continue BID for now if that is  ?                          helping. Weight loss would be beneficial. If  ?                          symptoms persist despite BID PPI then may need to  ?                          consider other interventions to treat GERD, patient  ?                          is a candidate for TIF in the future if that is  ?                          considered. Would need objective evidence of GERD  ?                          on pH study or barium study prior to considering  ?                          TIF. ?Recommendation:           - Patient has a contact number available for  ?                          emergencies. The signs and symptoms of potential  ?                          delayed complications were discussed with the  ?                          patient. Return to normal activities tomorrow.  ?                          Written discharge instructions were provided to the  ?                          patient. ?                          - Resume previous diet. ?                          - Continue present medications. ?                          - Await pathology results. ?Carlota Raspberry. Havery Moros, MD ?02/22/2022 3:22:35 PM ?This report has been signed electronically. ?

## 2022-02-22 NOTE — Progress Notes (Signed)
Sedate, gd SR, tolerated procedure well, VSS, report to RN 

## 2022-02-22 NOTE — Progress Notes (Signed)
[facility] History and Physical ? ? ?Primary Care Physician:  Cyndy Freeze, MD ? ? ?Reason for Procedure:   GERD, nausea, epigastric pain ? ?Plan:    EGD ? ? ? ? ?HPI: Luis Roberts is a 46 y.o. male  here for EGD to further evaluate persistent GERD, nausea, epigastric pain. On  lower dose PPI did not have much benefit. Now on protonix '40mg'$  BID and pepcid q HS. Has had benefit with the regimen, about 60% improved but still with persistent symptoms that bother him, especially at night. Otherwise feels well without any cardiopulmonary symptoms.  ? ? ?Past Medical History:  ?Diagnosis Date  ? Anxiety   ? Complication of anesthesia   ? Woke up during surgeries, last surgery  took 1.5 hours to awaken  ? DDD (degenerative disc disease), lumbar   ? 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 History of kidney stones   ? passed  ? History of seasonal allergies   ? Morbid obesity ([place])   ? Pulmonary emboli ([place])   ? ? ?Past Surgical History:  ?Procedure Laterality Date  ? arthroscopic knee surgery    ? BACK SURGERY  2007ish  ? lumbar laminectomy L5- S1  ? COLONOSCOPY    ? ESOPHAGOGASTRODUODENOSCOPY    ? KNEE SURGERY Left   ? left knee w/ screw  ? LUMBAR LAMINECTOMY/DECOMPRESSION MICRODISCECTOMY Left 04/15/2014  ? Procedure: LUMBAR LAMINECTOMY/DECOMPRESSION MICRODISCECTOMY 1 LEVEL;  Surgeon: [name], MD;  Location: [facility];  Service: Neurosurgery;  Laterality: Left;  Left Lumbar Four-Five Microdiskectomy  ? LUMBAR LAMINECTOMY/DECOMPRESSION MICRODISCECTOMY N/A 05/20/2017  ? Procedure: LUMBAR LAMINECTOMY/DECOMPRESSION MICRODISCECTOMY LUMBAR FOUR- LUMBAR FIVE;  Surgeon: Newman Pies, MD;  Location: [facility];  Service: Neurosurgery;  Laterality: N/A;  LUMBAR LAMINECTOMY/DECOMPRESSION MICRODISCECTOMY LUMBAR 4- LUMBAR 5  ? ? ?Prior to Admission medications   ?Medication Sig Start Date End Date Taking? Authorizing Provider  ?buPROPion (WELLBUTRIN XL) 300 MG 24 hr tablet Take 300 mg by mouth daily.   Yes [provider]  ?clonazePAM (KLONOPIN) 1 MG tablet Take 1 mg by mouth at bedtime as needed for anxiety.   Yes [provider]  ?escitalopram (LEXAPRO) 10 MG tablet Take 10 mg by mouth daily. 05/06/17  Yes [provider]  ?famotidine (PEPCID) 20 MG tablet Take 1 tablet (20 mg total) by mouth at bedtime. 01/18/22  Yes Zehr, Laban Emperor, PA-C  ?fexofenadine (ALLEGRA) 180 MG tablet Take 180 mg by mouth daily.   Yes [provider]  ?fluticasone (FLONASE) 50 MCG/ACT nasal spray Place 1 spray into both nostrils daily as needed for allergies or rhinitis.   Yes [provider]  ?lisinopril (ZESTRIL) 2.5 MG tablet Take 2.5 mg by mouth daily.   Yes [provider]  ?pantoprazole (PROTONIX) 40 MG tablet Take 1 tablet (40 mg total) by mouth 2 (two) times daily before a meal. 01/18/22  Yes Zehr, Janett Billow D, PA-C  ?testosterone cypionate (DEPOTESTOTERONE CYPIONATE) 100 MG/ML injection Inject 200 mg into the muscle every 14 (fourteen) days. For IM use only 1 injection weekly   Yes [provider]  ?topiramate (TOPAMAX) 50 MG tablet Take 50 mg by mouth daily.   Yes [provider]  ?cyclobenzaprine (FLEXERIL) 10 MG tablet Take 1 tablet (10 mg total) by mouth 3 (three) times daily as needed for muscle spasms. ?Patient not taking: Reported on 02/22/2022 05/21/17   Newman Pies, MD  ?gabapentin (NEURONTIN) 600 MG tablet Take 1 tablet by mouth 3 (three) times daily. As needed ?  Patient not taking: Reported on 02/22/2022 01/21/18   [provider]  ?loratadine (CLARITIN) 10 MG tablet Take 10 mg by mouth daily as needed for allergies.  ?Patient not taking: Reported on 02/22/2022    [provider]  ?oxyCODONE (ROXICODONE) 15 MG immediate release tablet Take 1 tablet (15 mg total) by mouth every 4 (four) hours as needed for moderate pain. ?Patient not taking: Reported on 02/22/2022 05/21/17   Newman Pies, MD  ?[facility] HFA 108 (90 Base) MCG/ACT inhaler Inhale 2 puffs into the lungs  every 4 (four) hours as needed. ?Patient not taking: Reported on 02/22/2022 01/14/18   [provider]  ? ? ?Current Outpatient Medications  ?Medication Sig Dispense Refill  ? buPROPion (WELLBUTRIN XL) 300 MG 24 hr tablet Take 300 mg by mouth daily.    ? clonazePAM (KLONOPIN) 1 MG tablet Take 1 mg by mouth at bedtime as needed for anxiety.    ? escitalopram (LEXAPRO) 10 MG tablet Take 10 mg by mouth daily.  6  ? famotidine (PEPCID) 20 MG tablet Take 1 tablet (20 mg total) by mouth at bedtime. 30 tablet 3  ? fexofenadine (ALLEGRA) 180 MG tablet Take 180 mg by mouth daily.    ? fluticasone (FLONASE) 50 MCG/ACT nasal spray Place 1 spray into both nostrils daily as needed for allergies or rhinitis.    ? lisinopril (ZESTRIL) 2.5 MG tablet Take 2.5 mg by mouth daily.    ? pantoprazole (PROTONIX) 40 MG tablet Take 1 tablet (40 mg total) by mouth 2 (two) times daily before a meal. 60 tablet 3  ? testosterone cypionate (DEPOTESTOTERONE CYPIONATE) 100 MG/ML injection Inject 200 mg into the muscle every 14 (fourteen) days. For IM use only 1 injection weekly    ? topiramate (TOPAMAX) 50 MG tablet Take 50 mg by mouth daily.    ? cyclobenzaprine (FLEXERIL) 10 MG tablet Take 1 tablet (10 mg total) by mouth 3 (three) times daily as needed for muscle spasms. (Patient not taking: Reported on 02/22/2022) 50 tablet 1  ? gabapentin (NEURONTIN) 600 MG tablet Take 1 tablet by mouth 3 (three) times daily. As needed (Patient not taking: Reported on 02/22/2022)  1  ? loratadine (CLARITIN) 10 MG tablet Take 10 mg by mouth daily as needed for allergies.  (Patient not taking: Reported on 02/22/2022)    ? oxyCODONE (ROXICODONE) 15 MG immediate release tablet Take 1 tablet (15 mg total) by mouth every 4 (four) hours as needed for moderate pain. (Patient not taking: Reported on 02/22/2022) 40 tablet 0  ? PROAIR HFA 108 (90 Base) MCG/ACT inhaler Inhale 2 puffs into the lungs every 4 (four) hours as needed. (Patient not taking: Reported on 02/22/2022)     ? ?Current Facility-Administered Medications  ?Medication Dose Route Frequency Provider Last Rate Last Admin  ? 0.9 %  sodium chloride infusion  500 mL Intravenous Once Lakevia Perris, Carlota Raspberry, MD      ? ? ?Allergies as of 02/22/2022 - Review Complete 02/22/2022  ?Allergen Reaction Noted  ? Aleve [naproxen sodium] Other (See Comments) 04/14/2014  ? ? ?Family History  ?Problem Relation Age of Onset  ? Hypertension Mother   ? Uterine cancer Mother   ? Pulmonary embolism Father 20  ? Colon cancer Maternal Grandfather   ? Stomach cancer Neg Hx   ? Esophageal cancer Neg Hx   ? Pancreatic cancer Neg Hx   ? ? ?Social History  ? ?Socioeconomic History  ? Marital status: Married  ?  Spouse  name: Not on file  ? Number of children: Not on file  ? Years of education: Not on file  ? Highest education level: Not on file  ?Occupational History  ? Not on file  ?Tobacco Use  ? Smoking status: Never  ? Smokeless tobacco: Never  ?Vaping Use  ? Vaping Use: Never used  ?Substance and Sexual Activity  ? Alcohol use: Yes  ?  Comment: socially  ? Drug use: No  ? Sexual activity: Yes  ?Other Topics Concern  ? Not on file  ?Social History Narrative  ? Not on file  ? ?Social Determinants of Health  ? ?Financial Resource Strain: Not on file  ?Food Insecurity: Not on file  ?Transportation Needs: Not on file  ?Physical Activity: Not on file  ?Stress: Not on file  ?Social Connections: Not on file  ?Intimate Partner Violence: Not on file  ? ? ?Review of Systems: ?All other review of systems negative except as mentioned in the HPI. ? ?Physical Exam: ?Vital signs ?BP 119/77   Pulse 87   Temp 98 ?F (36.7 ?C)   Ht '6\' 5"'$  (1.956 m)   Wt 285 lb (129.3 kg)   SpO2 97%   BMI 33.80 kg/m?  ? ?General:   Alert,  Well-developed, pleasant and cooperative in NAD ?Lungs:  Clear throughout to auscultation.   ?Heart:  Regular rate and rhythm ?Abdomen:  Soft, nontender and nondistended.   ?Neuro/Psych:  Alert and cooperative. Normal mood and affect. A and O x  3 ? ?Jolly Mango, MD ?[facility] ? ? ?

## 2022-02-22 NOTE — Progress Notes (Signed)
Called to room to assist during endoscopic procedure.  Patient ID and intended procedure confirmed with present staff. Received instructions for my participation in the procedure from the performing physician.  

## 2022-02-23 ENCOUNTER — Telehealth: Payer: Self-pay

## 2022-02-23 NOTE — Telephone Encounter (Signed)
Pharmacy called and indicated that Carafate tablets are on back order and asked if they could substitute suspension as it is covered with a $10 co pay.  Approved change to suspension, 10 ml q8h prn. ?

## 2022-02-26 ENCOUNTER — Telehealth: Payer: Self-pay | Admitting: *Deleted

## 2022-02-26 NOTE — Telephone Encounter (Signed)
?  Follow up Call- ? ?Call back number 02/22/2022 04/18/2021  ?Post procedure Call Back phone  # [phone]  ?Permission to leave phone message Yes Yes  ?Some recent data might be hidden  ?  ? ?Patient questions: ? ?Do you have a fever, pain , or abdominal swelling? No. ?Pain Score  0 * ? ?Have you tolerated food without any problems? Yes.   ? ?Have you been able to return to your normal activities? Yes.   ? ?Do you have any questions about your discharge instructions: ?Diet   No. ?Medications  No. ?Follow up visit  No. ? ?Do you have questions or concerns about your Care? No. ? ?Actions: ?* If pain score is 4 or above: ?No action needed, pain <4. ? ?Have you developed a fever since your procedure? no ? ?2.   Have you had an respiratory symptoms (SOB or cough) since your procedure? no ? ?3.   Have you tested positive for COVID 19 since your procedure no ? ?4.   Have you had any family members/close contacts diagnosed with the COVID 19 since your procedure?  no ? ? ?If yes to any of these questions please route to Joylene John, RN and Joella Prince, RN  ? ? ?

## 2022-04-04 ENCOUNTER — Other Ambulatory Visit: Payer: Self-pay | Admitting: Gastroenterology

## 2022-04-04 DIAGNOSIS — K449 Diaphragmatic hernia without obstruction or gangrene: Secondary | ICD-10-CM

## 2022-04-04 DIAGNOSIS — K219 Gastro-esophageal reflux disease without esophagitis: Secondary | ICD-10-CM

## 2022-04-04 DIAGNOSIS — R1013 Epigastric pain: Secondary | ICD-10-CM

## 2022-04-04 DIAGNOSIS — R110 Nausea: Secondary | ICD-10-CM

## 2022-05-03 ENCOUNTER — Other Ambulatory Visit: Payer: Self-pay | Admitting: Gastroenterology

## 2022-05-03 DIAGNOSIS — R1013 Epigastric pain: Secondary | ICD-10-CM

## 2022-05-03 DIAGNOSIS — K219 Gastro-esophageal reflux disease without esophagitis: Secondary | ICD-10-CM

## 2022-05-03 DIAGNOSIS — R110 Nausea: Secondary | ICD-10-CM

## 2022-05-03 DIAGNOSIS — K449 Diaphragmatic hernia without obstruction or gangrene: Secondary | ICD-10-CM

## 2022-05-08 ENCOUNTER — Ambulatory Visit (INDEPENDENT_AMBULATORY_CARE_PROVIDER_SITE_OTHER): Payer: 59 | Admitting: Emergency Medicine

## 2022-05-08 ENCOUNTER — Encounter: Payer: Self-pay | Admitting: Emergency Medicine

## 2022-05-08 VITALS — BP 128/80 | HR 86 | Temp 98.1°F | Ht 77.0 in | Wt 285.8 lb

## 2022-05-08 DIAGNOSIS — R0609 Other forms of dyspnea: Secondary | ICD-10-CM

## 2022-05-08 DIAGNOSIS — R0602 Shortness of breath: Secondary | ICD-10-CM

## 2022-05-08 NOTE — Progress Notes (Unsigned)
Subjective:    Patient ID: Luis Roberts, male    DOB: [birth year], 46 y.o.   MRN: [id number]  HPI  46 year old man, never smoker, with a history of seasonal allergies, obesity, provoked pulmonary embolism (following sgy).  Subsequent heme eval was reassuring, no evidence of inherited process. He is referred today for evaluation of chest tightness and dyspnea.  Symptoms started when he had COVID-19 in 11/2020.  He reports that he is feeling chest tightness that happens most when he gets a URI, quickly into flaring sx / bronchitis / PNA. In between he feels pretty well. Can occasionally have chest tightness w walking a long distance. Has benefited from albuterol. Good overall functional capacity.  He has GERD, is on protonix and is being considered for nissen. 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   Social History   Socioeconomic History   Marital status: Married    Spouse name: Not on file   Number of children: Not on file   Years of education: Not on file   Highest education level: Not on file  Occupational History   Not on file  Tobacco Use   Smoking status: Never   Smokeless tobacco: Never  Vaping Use   Vaping Use: Never used  Substance and Sexual Activity   Alcohol use: Yes    Comment: socially   Drug use: No    Sexual activity: Yes  Other Topics Concern   Not on file  Social History Narrative   Not on file   Social Determinants of Health   Financial Resource Strain: Not on file  Food Insecurity: Not on file  Transportation Needs: Not on file  Physical Activity: Not on file  Stress: Not on file  Social Connections: Not on file  Intimate Partner Violence: Not on file    From [place] No [employer] Works in Press photographer with heavy equipment, 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     clonazePAM (KLONOPIN) 1 MG tablet Take 1 mg by mouth at bedtime as needed for anxiety.     cyclobenzaprine (FLEXERIL) 10 MG tablet Take 1 tablet (10 mg total) by mouth 3 (three) times daily as needed for muscle spasms. 50 tablet 1   escitalopram (LEXAPRO) 20 MG tablet      famotidine (PEPCID) 20 MG  tablet Take 1 tablet (20 mg total) by mouth at bedtime. 90 tablet 1   fexofenadine (ALLEGRA) 180 MG tablet Take 180 mg by mouth daily.     fluticasone (FLONASE) 50 MCG/ACT nasal spray Place 1 spray into both nostrils daily as needed for allergies or rhinitis.     gabapentin (NEURONTIN) 600 MG tablet Take 1 tablet by mouth 3 (three) times daily. As needed  1   lisinopril (ZESTRIL) 2.5 MG tablet Take 2.5 mg by mouth daily.     montelukast (SINGULAIR) 10 MG tablet      oxyCODONE (ROXICODONE) 15 MG immediate release tablet Take 1 tablet (15 mg total) by mouth every 4 (four) hours as needed for moderate pain. 40 tablet 0   pantoprazole (PROTONIX) 40 MG tablet Take 1 tablet (40 mg total) by mouth daily. 90 tablet 1   PROAIR HFA 108 (90 Base) MCG/ACT inhaler Inhale 2 puffs into the lungs every 4 (four) hours as needed.     sucralfate  (CARAFATE) 1 GM/10ML suspension Take 10 mLs (1 g total) by mouth every 8 (eight) hours as needed. 414 mL 2   testosterone cypionate (DEPOTESTOTERONE CYPIONATE) 100 MG/ML injection Inject 200 mg into the muscle every 14 (fourteen) days. For IM use only 1 injection weekly     topiramate (TOPAMAX) 50 MG tablet Take 50 mg by mouth daily.     escitalopram (LEXAPRO) 10 MG tablet Take 10 mg by mouth daily.  6   loratadine (CLARITIN) 10 MG tablet Take 10 mg by mouth daily as needed for allergies.  (Patient not taking: Reported on 02/22/2022)     Facility-Administered Medications Prior to Visit  Medication Dose Route Frequency Provider Last Rate Last Admin   0.9 %  sodium chloride infusion  500 mL Intravenous Once Armbruster, Carlota Raspberry, MD             Objective:   Physical Exam Vitals:   05/08/22 1120  BP: 128/80  Pulse: 86  Temp: 98.1 F (36.7 C)  TempSrc: Oral  SpO2: 98%  Weight: 285 lb 12.8 oz (129.6 kg)  Height: '6\' 5"'$  (1.956 m)   Gen: Pleasant, well-nourished, in no distress,  normal affect  ENT: No lesions,  mouth clear,  oropharynx clear, no postnasal drip  Neck: No JVD, no stridor  Lungs: No use of accessory muscles, no crackles or wheezing on normal respiration, no wheeze on forced expiration  Cardiovascular: RRR, heart sounds normal, no murmur or gallops, no peripheral edema  Musculoskeletal: No deformities, no cyanosis or clubbing  Neuro: alert, awake, non focal  Skin: Warm, no lesions or rash      Assessment & Plan:   No problem-specific Assessment & Plan notes found for this encounter.   Baltazar Apo, MD, PhD 05/08/2022, 11:41 AM [facility] [phone] or if no answer before 7:00PM call [phone] For any issues after 7:00PM please call eLink [phone]

## 2022-05-08 NOTE — Patient Instructions (Signed)
We will perform pulmonary function testing in next office visit We will arrange for a CT scan of the chest Please continue current medications as you have been taking them. Keep your albuterol available to use 2 puffs if needed for shortness of breath, chest tightness, wheezing. Follow Dr. Lamonte Sakai next available with PFT so we can review the results together.

## 2022-05-09 NOTE — Assessment & Plan Note (Addendum)
Clinical syndrome is intermittent, has a flaring component to it, sounds like bronchospasm, usually brought on by URI or some trigger.  Question whether he has developed asthma due to his COVID-19 in 11/2020.  We will try to better characterize, perform pulmonary function testing.  He is working to control GERD and is on an allergy regimen.  Of note he is on lisinopril and we may need to stop this going forward depending on how symptoms evolve.  He had COVID-19 in 11/2020 and we will check a CT scan of the chest to ensure no scarring or other sequela given his flaring symptoms.

## 2022-06-15 ENCOUNTER — Other Ambulatory Visit: Payer: 59

## 2022-06-28 ENCOUNTER — Ambulatory Visit: Payer: 59 | Admitting: Emergency Medicine

## 2022-07-05 ENCOUNTER — Other Ambulatory Visit: Payer: Self-pay | Admitting: Gastroenterology

## 2022-07-05 DIAGNOSIS — R110 Nausea: Secondary | ICD-10-CM

## 2022-07-05 DIAGNOSIS — K449 Diaphragmatic hernia without obstruction or gangrene: Secondary | ICD-10-CM

## 2022-07-05 DIAGNOSIS — R1013 Epigastric pain: Secondary | ICD-10-CM

## 2022-07-05 DIAGNOSIS — K219 Gastro-esophageal reflux disease without esophagitis: Secondary | ICD-10-CM

## 2022-07-09 ENCOUNTER — Encounter: Payer: Self-pay | Admitting: Gastroenterology

## 2022-07-17 ENCOUNTER — Encounter: Payer: Self-pay | Admitting: Gastroenterology

## 2022-07-17 ENCOUNTER — Ambulatory Visit (INDEPENDENT_AMBULATORY_CARE_PROVIDER_SITE_OTHER): Payer: 59 | Admitting: Gastroenterology

## 2022-07-17 VITALS — BP 140/102 | HR 68 | Ht 77.0 in | Wt 289.8 lb

## 2022-07-17 DIAGNOSIS — K449 Diaphragmatic hernia without obstruction or gangrene: Secondary | ICD-10-CM | POA: Diagnosis not present

## 2022-07-17 DIAGNOSIS — K219 Gastro-esophageal reflux disease without esophagitis: Secondary | ICD-10-CM | POA: Diagnosis not present

## 2022-07-17 DIAGNOSIS — Z79899 Other long term (current) drug therapy: Secondary | ICD-10-CM

## 2022-07-17 MED ORDER — BACLOFEN 10 MG PO TABS: 5.0000 mg | ORAL_TABLET | Freq: Every day | ORAL | 2 refills | Status: DC

## 2022-07-17 MED ORDER — DEXLANSOPRAZOLE 60 MG PO CPDR: 60.0000 mg | DELAYED_RELEASE_CAPSULE | Freq: Every day | ORAL | 1 refills | Status: DC

## 2022-07-17 NOTE — Patient Instructions (Signed)
If you are age 47 or older, your body mass index should be between 23-30. Your Body mass index is 34.37 kg/m. If this is out of the aforementioned range listed, please consider follow up with your Primary Care Provider.  If you are age 79 or younger, your body mass index should be between 19-25. Your Body mass index is 34.37 kg/m. If this is out of the aformentioned range listed, please consider follow up with your Primary Care Provider.   ________________________________________________________  The [facility] providers would like to encourage you to use [facility] to communicate with providers for non-urgent requests or questions.  Due to long hold times on the telephone, sending your provider a message by [facility] may be a faster and more efficient way to get a response.  Please allow 48 business hours for a response.  Please remember that this is for non-urgent requests.  _______________________________________________________   We have sent the following medications to your pharmacy for you to pick up at your convenience: Dexilant 60 mg: Take once daily (note: if your insurance will not cover Dexilant, continue Protonix twice daily and Carafate) Baclofen 10 mg :Take 1/2 tablet daily at bedtime.  If tolerated, increase to 1 tablet daily at bedtime _____________________________________________________________   Dennis Bast have been scheduled for a Barium Esophogram at [facility] (1st floor of the hospital) on Tuesday, 07-24-22 at 10:30 am. Please arrive 30 minutes prior to your appointment for registration. Make certain not to have anything to eat or drink 3 hours prior to your test. If you need to reschedule for any reason, please contact radiology at [phone] to do so. __________________________________________________________________ A barium swallow is an examination that concentrates on views of the esophagus. This tends to be a double contrast exam (barium and two liquids which, when  combined, create a gas to distend the wall of the oesophagus) or single contrast (non-ionic iodine based). The study is usually tailored to your symptoms so a good history is essential. Attention is paid during the study to the form, structure and configuration of the esophagus, looking for functional disorders (such as aspiration, dysphagia, achalasia, motility and reflux) EXAMINATION You may be asked to change into a gown, depending on the type of swallow being performed. A radiologist and radiographer will perform the procedure. The radiologist will advise you of the type of contrast selected for your procedure and direct you during the exam. You will be asked to stand, sit or lie in several different positions and to hold a small amount of fluid in your mouth before being asked to swallow while the imaging is performed .In some instances you may be asked to swallow barium coated marshmallows to assess the motility of a solid food bolus. The exam can be recorded as a digital or video fluoroscopy procedure. POST PROCEDURE It will take 1-2 days for the barium to pass through your system. To facilitate this, it is important, unless otherwise directed, to increase your fluids for the next 24-48hrs and to resume your normal diet.  This test typically takes about 30 minutes to perform. ___________________________________________________________   Thank you for entrusting me with your care and for choosing [facility], Dr. Richton Cellar

## 2022-07-17 NOTE — Progress Notes (Signed)
HPI :  46 year old male here for follow-up visit for reflux.  Recall he has had symptoms of severe reflux ongoing for some years now.  Index symptoms are pyrosis, regurgitation, cough, congestion and throat.  He has been on a variety of PPIs in the past to include Prilosec, Nexium, Prevacid, and most recently Protonix 40 mg twice daily.  He does think that Protonix has helped reduce his symptoms but certainly has not resolved and he continues to have a lot of symptoms that bother him.  He is taking Carafate as well in the morning and at night which does help his nocturnal symptoms.  He has significant nocturnal symptoms with regurgitation and cough that bothers him.  He has been seen by pulmonologist about 6 years ago he states and was told he had GERD causing his cough.  He also has a lot of daytime symptoms that continue to bother him with pyrosis etc.  He has been taking his Protonix prior to meals.  His body mass index is 34.3, 289 pounds.  He states he has had low testosterone levels and now that he is back on testosterone supplementation his weight is coming down, he is working hard to lose weight.  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  He is never tried baclofen before.  He takes Klonopin at baseline at night.  He had an EGD with me in March which showed small hiatal hernia with Hill grade 1 gastroesophageal flap.  No evidence of Barrett's or erosive esophagitis.  He feels that his reflux symptoms are getting worse over time, he is interested in alternative therapies.  Prior work-up: EGD 02/22/2022: - A 2 cm hiatal hernia was present. - The gastroesophageal flap valve was visualized endoscopically and classified as Hill Grade I (prominent fold, tight to endoscope). - The exam of the esophagus was otherwise normal. No Barrett's - The entire examined stomach was normal. Biopsies were taken with a cold forceps for Helicobacter pylori testing. - The duodenal bulb and second portion of the duodenum  were normal.  Surgical [P], gastric antrum and gastric body - REACTIVE GASTROPATHY - NO H. PYLORI OR INTESTINAL METAPLASIA IDENTIFIED - SEE COMMENT  Colonoscopy 04/18/2021:The perianal and digital rectal examinations were normal. - The terminal ileum appeared normal. - A 4 mm polyp was found in the ascending colon. The polyp was sessile. The polyp was removed with a cold snare. Resection and retrieval were complete. - Two flat and sessile polyps were found in the transverse colon. The polyps were 3 to 5 mm in size. These polyps were removed with a cold snare. Resection and retrieval were complete. - Internal hemorrhoids were found during retroflexion. - The exam was otherwise without abnormality.  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   Deep vein thrombosis (DVT) ([facility])    GERD (gastroesophageal reflux disease)    History of kidney stones    passed   History of seasonal allergies    Morbid obesity ([place])    Pulmonary emboli [facility])      Past Surgical History:  Procedure Laterality Date   BACK SURGERY  2007ish  lumbar laminectomy L5- S1   COLONOSCOPY     ESOPHAGOGASTRODUODENOSCOPY     KNEE SURGERY Left    left knee w/ screw; x 5   LUMBAR LAMINECTOMY/DECOMPRESSION MICRODISCECTOMY Left 04/15/2014   Procedure: LUMBAR LAMINECTOMY/DECOMPRESSION MICRODISCECTOMY 1 LEVEL;  Surgeon: [name], MD;  Location: [facility];  Service: Neurosurgery;  Laterality: Left;  Left Lumbar Four-Five Microdiskectomy   LUMBAR LAMINECTOMY/DECOMPRESSION MICRODISCECTOMY N/A 05/20/2017   Procedure: LUMBAR LAMINECTOMY/DECOMPRESSION  MICRODISCECTOMY LUMBAR FOUR- LUMBAR FIVE;  Surgeon: Newman Pies, MD;  Location: [facility];  Service: Neurosurgery;  Laterality: N/A;  LUMBAR LAMINECTOMY/DECOMPRESSION MICRODISCECTOMY LUMBAR 4- LUMBAR 5   Family History  Problem Relation Age of Onset   Hypertension Mother    Uterine cancer Mother    Pulmonary embolism Father 38   Colon cancer Maternal Grandfather    Stomach cancer Neg Hx    Esophageal cancer Neg Hx    Pancreatic cancer Neg Hx    Social History   Tobacco Use   Smoking status: Never   Smokeless tobacco: Never  Vaping Use   Vaping Use: Never used  Substance Use Topics   Alcohol use: Yes    Comment: socially   Drug use: No   Current Outpatient Medications  Medication Sig Dispense Refill   baclofen (LIORESAL) 10 MG tablet Take 0.5 tablets (5 mg total) by mouth at bedtime. If you tolerate 1/2 tablet daily at bedtime, increase to 1 tablet daily at bedtime 30 each 2   dexlansoprazole (DEXILANT) 60 MG capsule Take 1 capsule (60 mg total) by mouth daily. 90 capsule 1   buPROPion (WELLBUTRIN XL) 300 MG 24 hr tablet Take 300 mg by mouth daily.     clonazePAM (KLONOPIN) 1 MG tablet Take 1 mg by mouth at bedtime as needed for anxiety.     cyclobenzaprine (FLEXERIL) 10 MG tablet Take 1 tablet (10 mg total) by mouth 3 (three) times daily as needed for muscle spasms. 50 tablet 1   escitalopram (LEXAPRO) 20 MG tablet      famotidine (PEPCID) 20 MG tablet Take 1 tablet (20 mg total) by mouth at bedtime. 30 tablet 3   fexofenadine (ALLEGRA) 180 MG tablet Take 180 mg by mouth daily.     fluticasone (FLONASE) 50 MCG/ACT nasal spray Place 1 spray into both nostrils daily as needed for allergies or rhinitis.     gabapentin (NEURONTIN) 600 MG tablet Take 1 tablet by mouth 3 (three) times daily. As needed  1   lisinopril (ZESTRIL) 2.5 MG tablet Take 2.5 mg by mouth daily.     montelukast (SINGULAIR) 10 MG tablet      oxyCODONE (ROXICODONE) 15 MG immediate release tablet Take 1 tablet (15  mg total) by mouth every 4 (four) hours as needed for moderate pain. 40 tablet 0   pantoprazole (PROTONIX) 40 MG tablet Take 1 tablet (40 mg total) by mouth daily. 90 tablet 1   PROAIR HFA 108 (90 Base) MCG/ACT inhaler Inhale 2 puffs into the lungs every 4 (four) hours as needed.     sucralfate (CARAFATE) 1 GM/10ML suspension Take 10 mLs (1 g total) by mouth every 8 (eight) hours as needed. 414 mL 2   testosterone cypionate (DEPOTESTOTERONE CYPIONATE) 100 MG/ML injection Inject 200 mg into the muscle once a week. For IM use only 1 injection weekly     topiramate (TOPAMAX) 50 MG tablet Take 50 mg by mouth daily.     No current facility-administered medications for this visit.   Allergies  Allergen Reactions   Aleve [Naproxen Sodium] Other (See Comments)    Makes his skin feel like it's crawling     Review of Systems: All systems reviewed and negative except where noted in HPI.     Physical Exam: BP (!) 140/102   Pulse 68   Ht '6\' 5"'$  (1.956 m)   Wt 289 lb 12.8 oz (131.5 kg)   BMI 34.37 kg/m  Constitutional: Pleasant,well-developed, male in no acute distress. Neurological: Alert and oriented to person place and time. Psychiatric: Normal mood and affect. Behavior is normal.   ASSESSMENT: 46 y.o. male here for assessment of the following  1. Gastroesophageal reflux disease, unspecified whether esophagitis present   2. Hiatal hernia   3. Long-term current use of proton pump inhibitor therapy    As above, longstanding GERD that is improved but not resolved with high-dose Protonix twice daily and Carafate at night.  Continues to have significant nocturnal symptoms that bother him routinely.  As above he has failed numerous PPIs in the past.  We discussed options, namely interventions to help treat his refractory reflux symptoms to include surgery and TIF.  I do think he is a candidate for TIF in hopes of avoiding an operation to treat this.  We discussed with TIF is, risks and  benefits.  His BMI is just under the threshold to be considered for this, I do think additional weight loss would help reduce his symptoms and he will work on this.  Otherwise, if considering TIF he needs some objective evidence of reflux even though I am very confident he has reflux driving symptoms, his EGD was normal.  In this light we will send him for barium study to see if we see objective reflux.  If this is negative he may need a pH study.  Once this has been confirmed I will set him up to see Dr. Bryan Lemma to discuss possible TIF.  In the interim again he will work on weight loss.  I did review long-term risk benefits of chronic PPI with him and he understands this.  Long-term want to use the lowest dose of medicine needed to control symptoms or if he has a TIF, will hopefully be able to come off PPI.  In the interim we discussed given his failure of multiple PPIs in the past, switching to Dexilant 60 mg daily if his insurance will cover this.  We also discussed alternative reflux treatment.  Given his significant nocturnal regurgitation, I offered him a trial of baclofen.  He does take Klonopin, needs to be monitored if taking this for drowsiness.  He will start 5 mg nightly and see if he tolerates it, if so can take 10 mg nightly.  If he feels too drowsy or has a hard time waking up in the morning, would stop the baclofen.  He agrees with the plan as outlined, I will contact him with barium study results with further recommendations.   PLAN: - barium swallow -assess for objective evidence of reflux, if positive will refer to Dr. Loletha Grayer for TIF evaluation - switch protonix to Dexilant '60mg'$  q day - if insurance covers he will take it, if not covered continue protonix '40mg'$  BId and carafate - add baclofen q HS - trial of low dose '5mg'$  q HS (while on klonopin) and if tolerates then increase to '10mg'$  qHS - work on weight loss- he is doing this  Jolly Mango, MD [facility]

## 2022-07-24 ENCOUNTER — Ambulatory Visit (HOSPITAL_COMMUNITY): Payer: 59

## 2022-08-08 ENCOUNTER — Ambulatory Visit (HOSPITAL_COMMUNITY)
Admission: RE | Admit: 2022-08-08 | Discharge: 2022-08-08 | Disposition: A | Discharge status: Home / Self Care | Payer: 59 | Source: Ambulatory Visit | Attending: Gastroenterology | Admitting: Gastroenterology

## 2022-08-08 DIAGNOSIS — K219 Gastro-esophageal reflux disease without esophagitis: Secondary | ICD-10-CM | POA: Insufficient documentation

## 2022-08-08 DIAGNOSIS — K449 Diaphragmatic hernia without obstruction or gangrene: Secondary | ICD-10-CM | POA: Diagnosis present

## 2022-08-08 DIAGNOSIS — Z79899 Other long term (current) drug therapy: Secondary | ICD-10-CM | POA: Diagnosis present

## 2022-09-11 ENCOUNTER — Encounter: Payer: Self-pay | Admitting: Emergency Medicine

## 2022-09-11 ENCOUNTER — Ambulatory Visit (INDEPENDENT_AMBULATORY_CARE_PROVIDER_SITE_OTHER): Payer: 59 | Admitting: Emergency Medicine

## 2022-09-11 DIAGNOSIS — R0609 Other forms of dyspnea: Secondary | ICD-10-CM

## 2022-09-11 DIAGNOSIS — R0602 Shortness of breath: Secondary | ICD-10-CM | POA: Diagnosis not present

## 2022-09-11 DIAGNOSIS — R059 Cough, unspecified: Secondary | ICD-10-CM | POA: Diagnosis not present

## 2022-09-11 LAB — PULMONARY FUNCTION TEST
DL/VA % pred: 110 %
DL/VA: 4.85 ml/min/mmHg/L
DLCO cor % pred: 94 %
DLCO cor: 35.99 ml/min/mmHg
DLCO unc % pred: 94 %
DLCO unc: 35.99 ml/min/mmHg
FEF 25-75 Post: 5.24 L/sec
FEF 25-75 Pre: 5.02 L/sec
FEF2575-%Change-Post: 4 %
FEF2575-%Pred-Post: 117 %
FEF2575-%Pred-Pre: 112 %
FEV1-%Change-Post: 2 %
FEV1-%Pred-Post: 92 %
FEV1-%Pred-Pre: 90 %
FEV1-Post: 4.72 L
FEV1-Pre: 4.63 L
FEV1FVC-%Change-Post: 1 %
FEV1FVC-%Pred-Pre: 105 %
FEV6-%Change-Post: 0 %
FEV6-%Pred-Post: 87 %
FEV6-%Pred-Pre: 87 %
FEV6-Post: 5.62 L
FEV6-Pre: 5.57 L
FEV6FVC-%Pred-Post: 103 %
FEV6FVC-%Pred-Pre: 103 %
FVC-%Change-Post: 0 %
FVC-%Pred-Post: 85 %
FVC-%Pred-Pre: 85 %
FVC-Post: 5.62 L
FVC-Pre: 5.6 L
Post FEV1/FVC ratio: 84 %
Post FEV6/FVC ratio: 100 %
Pre FEV1/FVC ratio: 83 %
Pre FEV6/FVC Ratio: 100 %
RV % pred: 101 %
RV: 2.43 L
TLC % pred: 94 %
TLC: 8.07 L

## 2022-09-11 MED ORDER — VALSARTAN 80 MG PO TABS: 80.0000 mg | ORAL_TABLET | Freq: Every day | ORAL | 2 refills | Status: DC

## 2022-09-11 NOTE — Progress Notes (Signed)
Full PFT performed today. °

## 2022-09-11 NOTE — Patient Instructions (Addendum)
We reviewed your pulmonary function testing today.  This looks good, normal airflows, normal lung volumes, normal gas exchange.  Great news. We will work to obtain a copy of your CT chest from [facility] in [place]. Stop your lisinopril. Try starting valsartan 80 mg once daily.   Continue your reflux medication and your rhinitis/allergy medications as you have been taking them. Follow with Dr. Lamonte Sakai in 2 months to review your status and to review your CT scan.

## 2022-09-11 NOTE — Progress Notes (Signed)
Subjective:    Patient ID: Luis Roberts, male    DOB: 05/30/1976, 46 y.o.   MRN: [id number]  HPI  46 year old man, never smoker, with a history of seasonal allergies, obesity, provoked pulmonary embolism (following sgy).  Subsequent heme eval was reassuring, no evidence of inherited process. He is referred today for evaluation of chest tightness and dyspnea.  Symptoms started when he had COVID-19 in 11/2020.  He reports that he is feeling chest tightness that happens most when he gets a URI, quickly into flaring sx / bronchitis / PNA. In between he feels pretty well. Can occasionally have chest tightness w walking a long distance. Has benefited from albuterol. Good overall functional capacity.  He has GERD, is on protonix and is being considered for nissen. On allergy regimen > takes reliably Of note, on lisinopril  ROV 09/11/22 --follow-up visit for 46 year old gentleman, never smoker with a history of provoked pulmonary embolism (after surgery), seasonal allergies.  He had COVID-19 in 12/21 and has felt some shortness of breath, chest discomfort and tightness since then.  He has GERD, chronic rhinitis. Since last time he reports that his breathing feels about the same, some SOB with heavier exertion. He makes noise when he breathes - others can hear it. Sometimes helped by SABA. Does have some cough w exertion He believes he had a CT chest in [employer], [facility]?? I don't have any images.   Pulmonary function testing performed 09/11/2022 reviewed by me shows grossly normal airflows without a bronchodilator response, normal lung volumes, normal diffusion capacity, normal flow volume loop.  He had a modified barium swallow 08/08/2022.  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   Social History   Socioeconomic History   Marital status: Married    Spouse name: Not on file   Number of children: Not on file   Years of education: Not on file   Highest education level: Not on file  Occupational History   Not on file  Tobacco Use   Smoking status: Never   Smokeless tobacco: Never  Vaping Use   Vaping Use: Never used  Substance and Sexual Activity   Alcohol use: Yes    Comment: socially   Drug use: No   Sexual activity: Yes  Other Topics Concern   Not on file  Social History Narrative   Not on file   Social Determinants of Health   Financial Resource Strain: Not on file  Food Insecurity: Not on file  Transportation Needs: Not on file  Physical Activity:  Not on file  Stress: Not on file  Social Connections: Not on file  Intimate Partner Violence: Not on file    From [facility] No [employer] Works in Press photographer with heavy equipment, 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 If you tolerate 1/2 tablet daily at bedtime, increase to 1 tablet daily at bedtime 30 each 2   buPROPion (WELLBUTRIN XL) 300 MG 24 hr tablet  Take 300 mg by mouth daily.     clonazePAM (KLONOPIN) 1 MG tablet Take 1 mg by mouth at bedtime as needed for anxiety.     cyclobenzaprine (FLEXERIL) 10 MG tablet Take 1 tablet (10 mg total) by mouth 3 (three) times daily as needed for muscle spasms. 50 tablet 1   dexlansoprazole (DEXILANT) 60 MG capsule Take 1 capsule (60 mg total) by mouth daily. 90 capsule 1   escitalopram (LEXAPRO) 20 MG tablet      famotidine (PEPCID) 20 MG tablet Take 1 tablet (20 mg total) by mouth at bedtime. 30 tablet 3   fexofenadine (ALLEGRA) 180 MG tablet Take 180 mg by mouth daily.     fluticasone (FLONASE) 50 MCG/ACT nasal spray Place 1 spray into both nostrils daily as needed for allergies or rhinitis.     gabapentin (NEURONTIN) 600 MG tablet Take 1 tablet by mouth 3 (three) times daily. As needed  1   montelukast (SINGULAIR) 10 MG tablet      oxyCODONE (ROXICODONE) 15 MG immediate release tablet Take 1 tablet (15 mg total) by mouth every 4 (four) hours as needed for moderate pain. 40 tablet 0   pantoprazole (PROTONIX) 40 MG tablet Take 1 tablet (40 mg total) by mouth daily. 90 tablet 1   PROAIR HFA 108 (90 Base) MCG/ACT inhaler Inhale 2 puffs into the lungs every 4 (four) hours as needed.     testosterone cypionate (DEPOTESTOTERONE CYPIONATE) 100 MG/ML injection Inject 200 mg into the muscle once a week. For IM use only 1 injection weekly     topiramate (TOPAMAX) 50 MG tablet Take 50 mg by mouth daily.     lisinopril (ZESTRIL) 2.5 MG tablet Take 2.5 mg by mouth daily.     sucralfate (CARAFATE) 1 GM/10ML suspension Take 10 mLs (1 g total) by mouth every 8 (eight) hours as needed. 414 mL 2   No facility-administered medications prior to visit.         Objective:   Physical Exam Vitals:   09/11/22 1638  BP: 110/82  Pulse: 95  SpO2: 97%  Weight: 285 lb (129.3 kg)  Height: '6\' 6"'$  (1.981 m)   Gen: Pleasant, well-nourished, in no distress,  normal affect  ENT: No lesions,  mouth clear,  oropharynx clear,  no postnasal drip  Neck: No JVD, no stridor  Lungs: No use of accessory muscles, no crackles or wheezing on normal respiration, no wheeze on forced expiration  Cardiovascular: RRR, heart sounds normal, no murmur or gallops, no peripheral edema  Musculoskeletal: No deformities, no cyanosis or clubbing  Neuro: alert, awake, non focal  Skin: Warm, no lesions or rash      Assessment & Plan:   Dyspnea on exertion Pulmonary function testing reassuring.  Question whether some of his dyspnea is related to deconditioning, superimposed upper airway cough and obstruction.  He had a CT chest that [facility] in [facility] and the need to obtain these images to ensure no interstitial changes after he had COVID-19 in 11/2020.  We will get a copy of these films.  For now no clear indication for bronchodilator therapy.  Cough Stop your lisinopril. Try starting valsartan 80 mg once daily.   Continue your reflux medication and your rhinitis/allergy medications as you have been taking them.   Baltazar Apo, MD, PhD 09/15/2022, 4:28 PM [facility] [phone] or if no answer before 7:00PM call [phone] For any issues after 7:00PM please call eLink [phone]

## 2022-09-11 NOTE — Patient Instructions (Signed)
Full PFT performed today. °

## 2022-09-14 ENCOUNTER — Other Ambulatory Visit: Payer: Self-pay | Admitting: Gastroenterology

## 2022-09-14 DIAGNOSIS — R110 Nausea: Secondary | ICD-10-CM

## 2022-09-14 DIAGNOSIS — K449 Diaphragmatic hernia without obstruction or gangrene: Secondary | ICD-10-CM

## 2022-09-14 DIAGNOSIS — R1013 Epigastric pain: Secondary | ICD-10-CM

## 2022-09-14 DIAGNOSIS — K219 Gastro-esophageal reflux disease without esophagitis: Secondary | ICD-10-CM

## 2022-09-15 NOTE — Assessment & Plan Note (Signed)
Stop your lisinopril. Try starting valsartan 80 mg once daily.   Continue your reflux medication and your rhinitis/allergy medications as you have been taking them.

## 2022-09-15 NOTE — Assessment & Plan Note (Signed)
Pulmonary function testing reassuring.  Question whether some of his dyspnea is related to deconditioning, superimposed upper airway cough and obstruction.  He had a CT chest that [facility] in [facility] and the need to obtain these images to ensure no interstitial changes after he had COVID-19 in 11/2020.  We will get a copy of these films.  For now no clear indication for bronchodilator therapy.

## 2022-09-29 ENCOUNTER — Other Ambulatory Visit: Payer: Self-pay | Admitting: Gastroenterology

## 2022-09-29 DIAGNOSIS — K449 Diaphragmatic hernia without obstruction or gangrene: Secondary | ICD-10-CM

## 2022-09-29 DIAGNOSIS — R110 Nausea: Secondary | ICD-10-CM

## 2022-09-29 DIAGNOSIS — R1013 Epigastric pain: Secondary | ICD-10-CM

## 2022-09-29 DIAGNOSIS — K219 Gastro-esophageal reflux disease without esophagitis: Secondary | ICD-10-CM

## 2022-10-27 ENCOUNTER — Other Ambulatory Visit: Payer: Self-pay | Admitting: Gastroenterology

## 2022-11-14 ENCOUNTER — Ambulatory Visit (INDEPENDENT_AMBULATORY_CARE_PROVIDER_SITE_OTHER): Payer: 59 | Admitting: Emergency Medicine

## 2022-11-14 ENCOUNTER — Encounter: Payer: Self-pay | Admitting: Emergency Medicine

## 2022-11-14 VITALS — BP 136/78 | HR 95 | Temp 98.1°F | Ht 77.0 in | Wt 290.2 lb

## 2022-11-14 DIAGNOSIS — J4520 Mild intermittent asthma, uncomplicated: Secondary | ICD-10-CM

## 2022-11-14 DIAGNOSIS — J310 Chronic rhinitis: Secondary | ICD-10-CM | POA: Diagnosis not present

## 2022-11-14 DIAGNOSIS — K219 Gastro-esophageal reflux disease without esophagitis: Secondary | ICD-10-CM

## 2022-11-14 DIAGNOSIS — R059 Cough, unspecified: Secondary | ICD-10-CM

## 2022-11-14 DIAGNOSIS — R0609 Other forms of dyspnea: Secondary | ICD-10-CM

## 2022-11-14 NOTE — Assessment & Plan Note (Signed)
Overall good control on Protonix and sucralfate

## 2022-11-14 NOTE — Assessment & Plan Note (Signed)
Fairly good control.  He did have a flare about 1 month ago that required prednisone and antibiotics.  He uses albuterol intermittently, not every day.  Did discuss with him that if his albuterol use, symptom burden increased then we could consider ICS.

## 2022-11-14 NOTE — Assessment & Plan Note (Signed)
Improved since we changed his ACE inhibitor to an ARB.  Plan to continue to control GERD and rhinitis as best we can.

## 2022-11-14 NOTE — Patient Instructions (Addendum)
We will get a copy of your CT scan from [facility] to review and compare to priors. Please keep your albuterol available to use 2 puffs when you needed for shortness of breath, chest tightness, wheezing.  Keep track of how frequently you use this.  If usage is becoming more frequent then we may need to consider starting additional inhaled medication to control symptoms. Continue your Singulair, Allegra, Flonase nasal spray as you have been taking them. You could consider seeing an allergist and discussing allergy shots if you are rhinitis symptoms are poorly controlled. Continue Protonix and sucralfate as you have been taking them. Follow with Dr. Lamonte Sakai in 12 months or sooner if you have any problems.

## 2022-11-14 NOTE — Progress Notes (Signed)
Subjective:    Patient ID: Luis Roberts, male    DOB: 07-05-1976, 46 y.o.   MRN: [id number]  HPI  ROV 09/11/22 --follow-up visit for 46 year old gentleman, never smoker with a history of provoked pulmonary embolism (after surgery), seasonal allergies.  He had COVID-19 in 12/21 and has felt some shortness of breath, chest discomfort and tightness since then.  He has GERD, chronic rhinitis. Since last time he reports that his breathing feels about the same, some SOB with heavier exertion. He makes noise when he breathes - others can hear it. Sometimes helped by SABA. Does have some cough w exertion He believes he had a CT chest in [employer], [facility]?? I don't have any images.   Pulmonary function testing performed 09/11/2022 reviewed by me shows grossly normal airflows without a bronchodilator response, normal lung volumes, normal diffusion capacity, normal flow volume loop.  He had a modified barium swallow 08/08/2022.  Identified a nonspecific esophageal dysmotility disorder without any ulceration  ROV 11/14/22 --Luis Roberts is 16 with a history of a provoked pulmonary embolism in the past, seasonal allergies, COVID-19 in 11/2020.  He also has GERD and chronic rhinitis.  I have seen him for exertional shortness of breath.  His PFT have been reassuring.  We had discussed possibly repeating a CT scan of the chest in the aftermath of his COVID, but this has not yet been done. Today he reports that his cough is better off ACE-I. He will still intermittently get exertional SOB w heavier exertion. Does still use albuterol occasionally and does get benefit. He remains on singulair, allegra, flonase. He is on protonix and sucralfate.     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   Social History   Socioeconomic History   Marital status: Married    Spouse name: Not on file   Number of children: Not on file   Years of education: Not on file   Highest education level: Not on file  Occupational History   Not on file  Tobacco Use   Smoking status: Never   Smokeless tobacco: Never  Vaping Use   Vaping Use: Never used  Substance and Sexual Activity   Alcohol use: Yes    Comment: socially   Drug use: No   Sexual activity: Yes  Other Topics Concern   Not on file  Social History Narrative   Not on file   Social Determinants of Health   Financial Resource Strain: Not on file  Food Insecurity: Not on file  Transportation Needs: Not on file  Physical Activity: Not on file  Stress: Not on  file  Social Connections: Not on file  Intimate Partner Violence: Not on file    From [facility] No [employer] Works in Press photographer with heavy equipment, 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 If you tolerate 1/2 tablet daily at bedtime, increase to 1 tablet daily at bedtime 30 tablet 2   buPROPion (WELLBUTRIN XL) 300 MG 24 hr tablet Take 300 mg by mouth daily.     clonazePAM (KLONOPIN) 1 MG tablet Take 1 mg by mouth  at bedtime as needed for anxiety.     cyclobenzaprine (FLEXERIL) 10 MG tablet Take 1 tablet (10 mg total) by mouth 3 (three) times daily as needed for muscle spasms. 50 tablet 1   dexlansoprazole (DEXILANT) 60 MG capsule Take 1 capsule (60 mg total) by mouth daily. 90 capsule 1   escitalopram (LEXAPRO) 20 MG tablet      famotidine (PEPCID) 20 MG tablet Take 1 tablet (20 mg total) by mouth at bedtime. 30 tablet 3   fexofenadine (ALLEGRA) 180 MG tablet Take 180 mg by mouth daily.     fluticasone (FLONASE) 50 MCG/ACT nasal spray Place 1 spray into both nostrils daily as needed for allergies or rhinitis.     gabapentin (NEURONTIN) 600 MG tablet Take 1 tablet by mouth 3 (three) times daily. As needed  1   montelukast (SINGULAIR) 10 MG tablet      oxyCODONE (ROXICODONE) 15 MG immediate release tablet Take 1 tablet (15 mg total) by mouth every 4 (four) hours as needed for moderate pain. 40 tablet 0   pantoprazole (PROTONIX) 40 MG tablet Take 1 tablet (40 mg total) by mouth daily. 90 tablet 1   PROAIR HFA 108 (90 Base) MCG/ACT inhaler Inhale 2 puffs into the lungs every 4 (four) hours as needed.     sucralfate (CARAFATE) 1 GM/10ML suspension Take 10 mLs (1 g total) by mouth every 8 (eight) hours as needed. 414 mL 2   testosterone cypionate (DEPOTESTOTERONE CYPIONATE) 100 MG/ML injection Inject 200 mg into the muscle once a week. For IM use only 1 injection weekly     topiramate (TOPAMAX) 50 MG tablet Take 50 mg by mouth daily.     valsartan (DIOVAN) 80 MG tablet Take 1 tablet (80 mg total) by mouth daily. 30 tablet 2   No facility-administered medications prior to visit.         Objective:   Physical Exam Vitals:   11/14/22 0954  BP: 136/78  Pulse: 95  Temp: 98.1 F (36.7 C)  TempSrc: Oral  SpO2: 98%  Weight: 290 lb 3.2 oz (131.6 kg)  Height: '6\' 5"'$  (1.956 m)   Gen: Pleasant, well-nourished, in no distress,  normal affect  ENT: No lesions,  mouth clear,  oropharynx clear, no postnasal  drip  Neck: No JVD, no stridor  Lungs: No use of accessory muscles, no crackles or wheezing on normal respiration, no wheeze on forced expiration  Cardiovascular: RRR, heart sounds normal, no murmur or gallops, no peripheral edema  Musculoskeletal: No deformities, no cyanosis or clubbing  Neuro: alert, awake, non focal  Skin: Warm, no lesions or rash      Assessment & Plan:  Dyspnea on exertion Overall his clinical symptoms are most consistent with mild intermittent asthma.  His pulmonary function testing has been reassuring.  We have not done methacholine challenge.  I am going to continue to try and treat him for asthma as below.  I will also review his CT scan of the chest that he got at [facility].  I did not realize that he had had it done but I want to compare it to his prior since he had COVID-19 in 11/2020, ensure no evidence of residual interstitial disease.  Cough Improved since we changed his ACE inhibitor to an ARB.  Plan to continue to control GERD and rhinitis as best we can.  Mild intermittent asthma Fairly good control.  He did have a flare about 1 month ago that required prednisone and antibiotics.  He uses albuterol intermittently, not every day.  Did discuss with him that if his albuterol use, symptom burden increased then we could consider ICS.  Chronic rhinitis A lot of breakthrough symptoms even on nasal steroid, Singulair, Allegra.  We talked today about possibly going to see an allergist to consider immunotherapy.  Is going to think about this.  GERD (gastroesophageal reflux disease) Overall good control on Protonix and sucralfate   Baltazar Apo, MD, PhD 11/14/2022, 10:29 AM [facility] [phone] or if no answer before 7:00PM call [phone] For any issues after 7:00PM please call eLink [phone]

## 2022-11-14 NOTE — Assessment & Plan Note (Signed)
Overall his clinical symptoms are most consistent with mild intermittent asthma.  His pulmonary function testing has been reassuring.  We have not done methacholine challenge.  I am going to continue to try and treat him for asthma as below.  I will also review his CT scan of the chest that he got at [facility].  I did not realize that he had had it done but I want to compare it to his prior since he had COVID-19 in 11/2020, ensure no evidence of residual interstitial disease.

## 2022-11-14 NOTE — Assessment & Plan Note (Signed)
A lot of breakthrough symptoms even on nasal steroid, Singulair, Allegra.  We talked today about possibly going to see an allergist to consider immunotherapy.  Is going to think about this.

## 2022-11-27 ENCOUNTER — Other Ambulatory Visit: Payer: Self-pay | Admitting: Gastroenterology

## 2022-11-27 ENCOUNTER — Other Ambulatory Visit: Payer: Self-pay | Admitting: Emergency Medicine

## 2022-11-27 DIAGNOSIS — R1013 Epigastric pain: Secondary | ICD-10-CM

## 2022-11-27 DIAGNOSIS — R110 Nausea: Secondary | ICD-10-CM

## 2022-11-27 DIAGNOSIS — K219 Gastro-esophageal reflux disease without esophagitis: Secondary | ICD-10-CM

## 2022-11-27 DIAGNOSIS — K449 Diaphragmatic hernia without obstruction or gangrene: Secondary | ICD-10-CM

## 2022-11-30 ENCOUNTER — Encounter: Payer: Self-pay | Admitting: Gastroenterology

## 2022-12-03 ENCOUNTER — Other Ambulatory Visit: Payer: Self-pay

## 2022-12-03 DIAGNOSIS — R110 Nausea: Secondary | ICD-10-CM

## 2022-12-03 DIAGNOSIS — K449 Diaphragmatic hernia without obstruction or gangrene: Secondary | ICD-10-CM

## 2022-12-03 DIAGNOSIS — K219 Gastro-esophageal reflux disease without esophagitis: Secondary | ICD-10-CM

## 2022-12-03 DIAGNOSIS — R1013 Epigastric pain: Secondary | ICD-10-CM

## 2022-12-03 MED ORDER — PANTOPRAZOLE SODIUM 40 MG PO TBEC: 40.0000 mg | DELAYED_RELEASE_TABLET | Freq: Every day | ORAL | 1 refills | Status: DC

## 2022-12-03 NOTE — Progress Notes (Signed)
Per patient he would like to go back to pantoprazole that worked better [employer] and is much cheaper.

## 2023-02-05 ENCOUNTER — Other Ambulatory Visit: Payer: Self-pay | Admitting: Gastroenterology

## 2023-03-06 ENCOUNTER — Other Ambulatory Visit: Payer: Self-pay | Admitting: Emergency Medicine

## 2023-05-10 ENCOUNTER — Other Ambulatory Visit: Payer: Self-pay | Admitting: Gastroenterology

## 2023-06-13 ENCOUNTER — Other Ambulatory Visit: Payer: Self-pay | Admitting: Emergency Medicine

## 2023-06-13 ENCOUNTER — Other Ambulatory Visit: Payer: Self-pay | Admitting: Gastroenterology

## 2023-06-13 DIAGNOSIS — K219 Gastro-esophageal reflux disease without esophagitis: Secondary | ICD-10-CM

## 2023-06-13 DIAGNOSIS — K449 Diaphragmatic hernia without obstruction or gangrene: Secondary | ICD-10-CM

## 2023-06-13 DIAGNOSIS — R110 Nausea: Secondary | ICD-10-CM

## 2023-06-13 DIAGNOSIS — R1013 Epigastric pain: Secondary | ICD-10-CM

## 2023-09-24 ENCOUNTER — Other Ambulatory Visit: Payer: Self-pay | Admitting: Emergency Medicine

## 2023-12-01 ENCOUNTER — Other Ambulatory Visit: Payer: Self-pay | Admitting: Gastroenterology

## 2023-12-01 DIAGNOSIS — K219 Gastro-esophageal reflux disease without esophagitis: Secondary | ICD-10-CM

## 2023-12-01 DIAGNOSIS — R1013 Epigastric pain: Secondary | ICD-10-CM

## 2023-12-01 DIAGNOSIS — K449 Diaphragmatic hernia without obstruction or gangrene: Secondary | ICD-10-CM

## 2023-12-01 DIAGNOSIS — R110 Nausea: Secondary | ICD-10-CM

## 2024-01-20 ENCOUNTER — Other Ambulatory Visit: Payer: Self-pay | Admitting: Emergency Medicine

## 2024-02-04 ENCOUNTER — Other Ambulatory Visit: Payer: Self-pay | Admitting: Gastroenterology

## 2024-02-04 DIAGNOSIS — K219 Gastro-esophageal reflux disease without esophagitis: Secondary | ICD-10-CM

## 2024-02-04 DIAGNOSIS — K449 Diaphragmatic hernia without obstruction or gangrene: Secondary | ICD-10-CM

## 2024-02-04 DIAGNOSIS — R110 Nausea: Secondary | ICD-10-CM

## 2024-02-04 DIAGNOSIS — R1013 Epigastric pain: Secondary | ICD-10-CM

## 2024-09-10 ENCOUNTER — Encounter: Payer: Self-pay | Admitting: Gastroenterology
